# Patient Record
Sex: Female | Born: 1995 | Race: Black or African American | Hispanic: No | Smoking: Never smoker
Health system: Southern US, Community
[De-identification: ages and names within clinical notes are randomized; demographics above are authoritative.]

## PROBLEM LIST (undated history)

## (undated) DIAGNOSIS — F32A Depression, unspecified: Secondary | ICD-10-CM

## (undated) DIAGNOSIS — G43909 Migraine, unspecified, not intractable, without status migrainosus: Secondary | ICD-10-CM

## (undated) DIAGNOSIS — A599 Trichomoniasis, unspecified: Secondary | ICD-10-CM

## (undated) DIAGNOSIS — M1990 Unspecified osteoarthritis, unspecified site: Secondary | ICD-10-CM

## (undated) DIAGNOSIS — I10 Essential (primary) hypertension: Secondary | ICD-10-CM

## (undated) HISTORY — DX: Migraine, unspecified, not intractable, without status migrainosus: G43.909

---

## 2019-04-07 ENCOUNTER — Emergency Department (HOSPITAL_COMMUNITY): Payer: Managed Care, Other (non HMO)

## 2019-04-07 ENCOUNTER — Emergency Department (HOSPITAL_COMMUNITY)
Admission: EM | Admit: 2019-04-07 | Discharge: 2019-04-07 | Disposition: A | Discharge status: Home / Self Care | Payer: Managed Care, Other (non HMO) | Attending: Emergency Medicine | Admitting: Emergency Medicine

## 2019-04-07 ENCOUNTER — Other Ambulatory Visit: Payer: Self-pay

## 2019-04-07 ENCOUNTER — Encounter (HOSPITAL_COMMUNITY): Payer: Self-pay | Admitting: Emergency Medicine

## 2019-04-07 DIAGNOSIS — M25561 Pain in right knee: Secondary | ICD-10-CM | POA: Diagnosis present

## 2019-04-07 MED ORDER — IBUPROFEN 800 MG PO TABS: 800.0000 mg | ORAL_TABLET | Freq: Three times a day (TID) | ORAL | 0 refills | Status: DC | PRN
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
## 2019-04-07 NOTE — Discharge Instructions (Signed)
Follow-up with the orthopedist provided.  Return here as needed.  Ice and elevate your knee.
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
## 2019-04-07 NOTE — ED Notes (Signed)
Patient ambulated to xray and back to room unassisted, without difficulty.

## 2019-04-07 NOTE — ED Notes (Signed)
An After Visit Summary was printed and given to the patient. Discharge instructions given and no further questions at this time.  

## 2019-04-27 ENCOUNTER — Other Ambulatory Visit: Payer: Self-pay

## 2019-04-27 ENCOUNTER — Encounter (HOSPITAL_COMMUNITY): Payer: Self-pay | Admitting: Emergency Medicine

## 2019-04-27 ENCOUNTER — Emergency Department (HOSPITAL_COMMUNITY)
Admission: EM | Admit: 2019-04-27 | Discharge: 2019-04-27 | Disposition: A | Discharge status: Home / Self Care | Payer: Managed Care, Other (non HMO) | Attending: Emergency Medicine | Admitting: Emergency Medicine

## 2019-04-27 DIAGNOSIS — G8929 Other chronic pain: Secondary | ICD-10-CM | POA: Diagnosis not present

## 2019-04-27 DIAGNOSIS — Z79899 Other long term (current) drug therapy: Secondary | ICD-10-CM | POA: Diagnosis not present

## 2019-04-27 DIAGNOSIS — M25561 Pain in right knee: Secondary | ICD-10-CM | POA: Diagnosis not present

## 2019-04-27 MED ORDER — MELOXICAM 15 MG PO TABS: 15.0000 mg | ORAL_TABLET | Freq: Every day | ORAL | 0 refills | Status: DC

## 2019-04-27 MED ORDER — LIDOCAINE 5 % EX PTCH: 1.0000 | MEDICATED_PATCH | CUTANEOUS | 0 refills | Status: DC

## 2019-04-27 MED ORDER — LIDOCAINE 5 % EX PTCH
1.0000 | MEDICATED_PATCH | CUTANEOUS | Status: DC
  Administered 2019-04-27: 1 via TRANSDERMAL
  Filled 2019-04-27: qty 1

## 2019-04-27 NOTE — ED Triage Notes (Signed)
Pt reports that she hurt her right knee over year ago. Reports was seen here for pain not to long ago and was told would need to see an orthopedic. Reports had swelling yesterday.
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
## 2019-04-27 NOTE — Discharge Instructions (Addendum)
Take anti-inflammatory as prescribed.  Follow-up with orthopedics.

## 2019-05-18 ENCOUNTER — Emergency Department (HOSPITAL_COMMUNITY)
Admission: EM | Admit: 2019-05-18 | Discharge: 2019-05-18 | Disposition: A | Discharge status: Home / Self Care | Payer: Managed Care, Other (non HMO) | Attending: Emergency Medicine | Admitting: Emergency Medicine

## 2019-05-18 ENCOUNTER — Encounter (HOSPITAL_COMMUNITY): Payer: Self-pay

## 2019-05-18 ENCOUNTER — Emergency Department (HOSPITAL_COMMUNITY): Payer: Managed Care, Other (non HMO)

## 2019-05-18 ENCOUNTER — Other Ambulatory Visit: Payer: Self-pay

## 2019-05-18 DIAGNOSIS — W19XXXA Unspecified fall, initial encounter: Secondary | ICD-10-CM | POA: Diagnosis not present

## 2019-05-18 DIAGNOSIS — Y999 Unspecified external cause status: Secondary | ICD-10-CM | POA: Insufficient documentation

## 2019-05-18 DIAGNOSIS — Z79899 Other long term (current) drug therapy: Secondary | ICD-10-CM | POA: Diagnosis not present

## 2019-05-18 DIAGNOSIS — Y9389 Activity, other specified: Secondary | ICD-10-CM | POA: Diagnosis not present

## 2019-05-18 DIAGNOSIS — Y9289 Other specified places as the place of occurrence of the external cause: Secondary | ICD-10-CM | POA: Insufficient documentation

## 2019-05-18 DIAGNOSIS — S8391XA Sprain of unspecified site of right knee, initial encounter: Secondary | ICD-10-CM | POA: Insufficient documentation

## 2019-05-18 DIAGNOSIS — S8991XA Unspecified injury of right lower leg, initial encounter: Secondary | ICD-10-CM | POA: Diagnosis present

## 2019-05-18 NOTE — ED Provider Notes (Signed)
[facility] DEPT Provider Note   CSN: [id number] Arrival date & time: 05/18/19  4098     History Chief Complaint  Patient presents with  . Knee Pain    Rachael Hahn is a 24 y.o. female.  Patient presents to the emergency department for evaluation of right knee injury.  Patient reports a fall earlier today.  She has a history of problems with the knee in the past, was told she might of torn a ligament.  Patient complaining of pain in the knee that worsens with movement.  No other injury from the fall.        History reviewed. No pertinent past medical history.  There are no problems to display for this patient.   History reviewed. No pertinent surgical history.   OB History   No obstetric history on file.     No family history on file.  Social History   Tobacco Use  . Smoking status: Not on file  Substance Use Topics  . Alcohol use: Not on file  . Drug use: Not on file    Home Medications Prior to Admission medications   Medication Sig Start Date End Date Taking? Authorizing Provider  ibuprofen (ADVIL) 800 MG tablet Take 1 tablet (800 mg total) by mouth every 8 (eight) hours as needed. 04/07/19   Lawyer, Harrell Gave, PA-C  lidocaine (LIDODERM) 5 % Place 1 patch onto the skin daily. Remove & Discard patch within 12 hours or as directed by MD 04/27/19   Henderly, Britni A, PA-C  meloxicam (MOBIC) 15 MG tablet Take 1 tablet (15 mg total) by mouth daily. 04/27/19   Henderly, Britni A, PA-C    Allergies    Amoxicillin  Review of Systems   Review of Systems  Musculoskeletal: Positive for arthralgias.  Neurological: Negative.     Physical Exam Updated Vital Signs BP (!) 140/99 (BP Location: Left Arm)   Pulse 83   Temp 98.1 F (36.7 C) (Oral)   Resp 18   SpO2 98%   Physical Exam Vitals and nursing note reviewed.  Constitutional:      Appearance: Normal appearance.  HENT:     Head: Atraumatic.  Pulmonary:     Effort:  Pulmonary effort is normal.  Musculoskeletal:     Right knee: No swelling, deformity, effusion, erythema, lacerations or crepitus. Normal range of motion. Tenderness present. No LCL laxity, MCL laxity, ACL laxity or PCL laxity. Normal alignment and normal patellar mobility.  Skin:    General: Skin is warm and dry.     Findings: No bruising or ecchymosis.  Neurological:     Mental Status: She is alert.     Sensory: Sensation is intact.     Motor: Motor function is intact.     ED Results / Procedures / Treatments   Labs (all labs ordered are listed, but only abnormal results are displayed) Labs Reviewed - No data to display  EKG None  Radiology DG Knee Complete 4 Views Right  Result Date: 05/18/2019 CLINICAL DATA:  Fall EXAM: RIGHT KNEE - COMPLETE 4+ VIEW COMPARISON:  04/07/2019 FINDINGS: No fracture or malalignment. Joint spaces are maintained. No significant knee effusion IMPRESSION: Negative. Electronically Signed   By: Donavan Foil M.D.   On: 05/18/2019 01:35    Procedures Procedures (including critical care time)  Medications Ordered in ED Medications - No data to display  ED Course  I have reviewed the triage vital signs and the nursing notes.  Pertinent labs &  imaging results that were available during my care of the patient were reviewed by me and considered in my medical decision making (see chart for details).    MDM Rules/Calculators/A&P                      X-ray negative.  I do not appreciate any ligamentous instability on examination.  Immobilized with knee immobilizer, NSAIDs.  Follow-up with orthopedics as needed.  Final Clinical Impression(s) / ED Diagnoses Final diagnoses:  Sprain of right knee, unspecified ligament, initial encounter    Rx / DC Orders ED Discharge Orders    None       Javed Cotto, Canary Brim, MD 05/18/19 [id number]

## 2019-05-18 NOTE — ED Notes (Signed)
Below order not completed by EW. 

## 2019-05-18 NOTE — Discharge Instructions (Signed)
Rest and ice the area today.  Take Motrin and/or Tylenol as needed for pain.  Follow-up with orthopedics if not improving.

## 2019-05-18 NOTE — ED Triage Notes (Signed)
Patient arrived stating that tonight she fell down 5 steps at work and is having pain in her right knee. States she already had problems with that knee but has not followed up with orthopedics yet. Patient ambulatory in triage.

## 2019-07-23 ENCOUNTER — Emergency Department (HOSPITAL_COMMUNITY)
Admission: EM | Admit: 2019-07-23 | Discharge: 2019-07-23 | Disposition: A | Discharge status: Home / Self Care | Payer: Managed Care, Other (non HMO) | Attending: Emergency Medicine | Admitting: Emergency Medicine

## 2019-07-23 ENCOUNTER — Emergency Department (HOSPITAL_COMMUNITY): Payer: Managed Care, Other (non HMO)

## 2019-07-23 ENCOUNTER — Encounter (HOSPITAL_COMMUNITY): Payer: Self-pay | Admitting: *Deleted

## 2019-07-23 DIAGNOSIS — F419 Anxiety disorder, unspecified: Secondary | ICD-10-CM

## 2019-07-23 DIAGNOSIS — R0789 Other chest pain: Secondary | ICD-10-CM | POA: Diagnosis not present

## 2019-07-23 LAB — POC URINE PREG, ED: Preg Test, Ur: NEGATIVE

## 2019-07-23 NOTE — Discharge Instructions (Signed)
You can take Tylenol or ibuprofen as needed to help with your symptoms. Follow-up using the resource guide for counseling. Return to the ER if you have worsening chest pain, shortness of breath, leg swelling or thoughts of wanting to harm yourself or others.

## 2019-07-23 NOTE — ED Triage Notes (Signed)
Per EMS, pt picked up from work, complains of anxiety and chest pain, worse with movement and breathing. She reports she has been under a lot of stress recently. EKG unremarkable.  BP 160/100 HR 86 O2 100% CBG 77

## 2019-07-23 NOTE — ED Provider Notes (Signed)
[facility]-EMERGENCY DEPT Provider Note   CSN: [id number] Arrival date & time: 07/23/19  8657     History Chief Complaint  Patient presents with  . Chest Pain  . Anxiety    Rachael Hahn is a 24 y.o. female presents to ED with a chief complaint of chest pain, anxiety.  States that she was at work prior to arrival on her break when she began feeling like her chest was tight and she was short of breath.  She states his symptoms have gradually improved while being in the ED.  Patient states that she is more anxious and depressed as usual, is tearful.  No specific trigger today to cause her to feel this way.  She denies leg swelling, history of DVT or PE, recent immobilization, cough, vomiting, abdominal pain.   Denies SI, HI, AVH.  HPI     History reviewed. No pertinent past medical history.  There are no problems to display for this patient.   History reviewed. No pertinent surgical history.   OB History   No obstetric history on file.     No family history on file.  Social History   Tobacco Use  . Smoking status: Never Smoker  . Smokeless tobacco: Never Used  Substance Use Topics  . Alcohol use: Not Currently  . Drug use: Not Currently    Home Medications Prior to Admission medications   Medication Sig Start Date End Date Taking? Authorizing Provider  ibuprofen (ADVIL) 800 MG tablet Take 1 tablet (800 mg total) by mouth every 8 (eight) hours as needed. 04/07/19   Lawyer, Cristal Deer, PA-C  lidocaine (LIDODERM) 5 % Place 1 patch onto the skin daily. Remove & Discard patch within 12 hours or as directed by MD 04/27/19   Henderly, Britni A, PA-C  meloxicam (MOBIC) 15 MG tablet Take 1 tablet (15 mg total) by mouth daily. 04/27/19   Henderly, Britni A, PA-C    Allergies    Amoxicillin  Review of Systems   Review of Systems  Constitutional: Negative for appetite change, chills and fever.  HENT: Negative for ear pain, rhinorrhea, sneezing and  sore throat.   Eyes: Negative for photophobia and visual disturbance.  Respiratory: Positive for shortness of breath. Negative for cough, chest tightness and wheezing.   Cardiovascular: Positive for chest pain. Negative for palpitations.  Gastrointestinal: Negative for abdominal pain, blood in stool, constipation, diarrhea, nausea and vomiting.  Genitourinary: Negative for dysuria, hematuria and urgency.  Musculoskeletal: Negative for myalgias.  Skin: Negative for rash.  Neurological: Negative for dizziness, weakness and light-headedness.  Psychiatric/Behavioral: Positive for dysphoric mood. The patient is nervous/anxious.     Physical Exam Updated Vital Signs BP (!) 155/105 (BP Location: Left Arm)   Pulse 75   Temp 98.4 F (36.9 C) (Oral)   Resp 18   SpO2 100%   Physical Exam Vitals and nursing note reviewed.  Constitutional:      General: She is not in acute distress.    Appearance: She is well-developed.  HENT:     Head: Normocephalic and atraumatic.     Nose: Nose normal.  Eyes:     General: No scleral icterus.       Left eye: No discharge.     Conjunctiva/sclera: Conjunctivae normal.  Cardiovascular:     Rate and Rhythm: Normal rate and regular rhythm.     Heart sounds: Normal heart sounds. No murmur heard.  No friction rub. No gallop.   Pulmonary:  Effort: Pulmonary effort is normal. No respiratory distress.     Breath sounds: Normal breath sounds.  Abdominal:     General: Bowel sounds are normal. There is no distension.     Palpations: Abdomen is soft.     Tenderness: There is no abdominal tenderness. There is no guarding.  Musculoskeletal:        General: Normal range of motion.     Cervical back: Normal range of motion and neck supple.  Skin:    General: Skin is warm and dry.     Findings: No rash.  Neurological:     Mental Status: She is alert.     Motor: No abnormal muscle tone.     Coordination: Coordination normal.  Psychiatric:     Comments:  Tearful.     ED Results / Procedures / Treatments   Labs (all labs ordered are listed, but only abnormal results are displayed) Labs Reviewed  POC URINE PREG, ED    EKG None  Radiology DG Chest 2 View  Result Date: 07/23/2019 CLINICAL DATA:  Chest pain. EXAM: CHEST - 2 VIEW COMPARISON:  None. FINDINGS: The heart size and mediastinal contours are within normal limits. Both lungs are clear. No pneumothorax or pleural effusion is noted. The visualized skeletal structures are unremarkable. IMPRESSION: No active cardiopulmonary disease. Electronically Signed   By: Marijo Conception M.D.   On: 07/23/2019 10:20    Procedures Procedures (including critical care time)  Medications Ordered in ED Medications - No data to display  ED Course  I have reviewed the triage vital signs and the nursing notes.  Pertinent labs & imaging results that were available during my care of the patient were reviewed by me and considered in my medical decision making (see chart for details).    MDM Rules/Calculators/A&P                          24 year old female presenting to the ED with a chief complaint of chest pain and anxiety.  Reports chest tightness and shortness of breath just prior to arrival while at work.  Reports increased stress, anxiety and depression recently.  She denies any SI, HI or AVH.  Denies any leg swelling, recent immobilization, history of DVT or PE.  On exam patient tearful, unwilling to disclose more information about recent stressors in her life.  Lungs are clear to auscultation bilaterally.  She is not tachycardic, tachypneic or hypoxic.  EKG shows normal sinus rhythm.  Chest x-ray shows no acute findings.  Patient resting comfortably here.  Attempted to call behavioral health specialist for further recommendations but patient stating she wants to be discharged without further evaluation.  I feel this is reasonable as her work-up is reassuring and she is not suicidal or homicidal.   She does not appear to be actively hallucinating.  She is requesting outpatient resources for counseling.  I suspect that her chest pain could be due to her recent anxiety attack.  I doubt ACS, she is PERC negative and no evidence of abnormalities on chest x-ray.  We will have her return for worsening symptoms.  All imaging, if done today, including plain films, CT scans, and ultrasounds, independently reviewed by me, and interpretations confirmed via formal radiology reads.  Patient is hemodynamically stable, in NAD, and able to ambulate in the ED. Evaluation does not show pathology that would require ongoing emergent intervention or inpatient treatment. I explained the diagnosis to the patient.  Pain has been managed and has no complaints prior to discharge. Patient is comfortable with above plan and is stable for discharge at this time. All questions were answered prior to disposition. Strict return precautions for returning to the ED were discussed. Encouraged follow up with PCP.   An After Visit Summary was printed and given to the patient.   Portions of this note were generated with Scientist, clinical (histocompatibility and immunogenetics). Dictation errors may occur despite best attempts at proofreading.  Final Clinical Impression(s) / ED Diagnoses Final diagnoses:  Chest wall pain  Anxiety    Rx / DC Orders ED Discharge Orders    None       Dietrich Pates, PA-C 07/23/19 1240    Lorre Nick, MD 07/25/19 [phone]

## 2019-09-13 ENCOUNTER — Encounter (HOSPITAL_COMMUNITY): Payer: Self-pay

## 2019-09-13 ENCOUNTER — Emergency Department (HOSPITAL_COMMUNITY)
Admission: EM | Admit: 2019-09-13 | Discharge: 2019-09-13 | Disposition: A | Discharge status: Home / Self Care | Payer: Managed Care, Other (non HMO) | Attending: Emergency Medicine | Admitting: Emergency Medicine

## 2019-09-13 ENCOUNTER — Other Ambulatory Visit: Payer: Self-pay

## 2019-09-13 DIAGNOSIS — J392 Other diseases of pharynx: Secondary | ICD-10-CM | POA: Insufficient documentation

## 2019-09-13 DIAGNOSIS — Z5321 Procedure and treatment not carried out due to patient leaving prior to being seen by health care provider: Secondary | ICD-10-CM | POA: Diagnosis not present

## 2019-09-13 DIAGNOSIS — R0981 Nasal congestion: Secondary | ICD-10-CM | POA: Insufficient documentation

## 2019-09-13 NOTE — ED Triage Notes (Signed)
Patient states she has nasal congestion and an itchy throat x 2 days. Patient states, "I am here to prove to my room mate that I do not have Covid."

## 2019-12-10 ENCOUNTER — Emergency Department (HOSPITAL_COMMUNITY)
Admission: EM | Admit: 2019-12-10 | Discharge: 2019-12-10 | Disposition: A | Discharge status: Home / Self Care | Payer: Managed Care, Other (non HMO) | Attending: Emergency Medicine | Admitting: Emergency Medicine

## 2019-12-10 ENCOUNTER — Encounter (HOSPITAL_COMMUNITY): Payer: Self-pay

## 2019-12-10 DIAGNOSIS — H6002 Abscess of left external ear: Secondary | ICD-10-CM

## 2019-12-10 MED ORDER — LIDOCAINE HCL 2 % IJ SOLN
5.0000 mL | Freq: Once | INTRAMUSCULAR | Status: AC
  Administered 2019-12-10: 100 mg via INTRADERMAL
  Filled 2019-12-10: qty 20

## 2019-12-10 NOTE — ED Triage Notes (Addendum)
Pt presents with c/o bump in her left ear. Pt reports the bump has been present for approx one week but just started hurting today.

## 2019-12-10 NOTE — Discharge Instructions (Signed)
Continue to apply warm compress to affected area several times daily for the next 3 days.  Take over the counter tylenol or ibuprofen as needed for pain.

## 2019-12-10 NOTE — ED Provider Notes (Signed)
[facility]-EMERGENCY DEPT Provider Note   CSN: [id number] Arrival date & time: 12/10/19  0957     History Chief Complaint  Patient presents with  . Bump in ear    Rachael Hahn is a 24 y.o. female.  The history is provided by the patient. No language interpreter was used.     24 year old female presenting with an ear complaint.   patient report for the past week she has noticed a bump today on here left earlobe.  Initially it was not painful but for the past 1 to 2 days she has noticed increasing pain when she palpated.  Pain is sharp, nonradiating without any hearing changes drainage.  No fever no recent injury.  She is not up-to-date with tetanus.  No specific treatment tried.  History reviewed. No pertinent past medical history.  There are no problems to display for this patient.   History reviewed. No pertinent surgical history.   OB History   No obstetric history on file.     Family History  Problem Relation Age of Onset  . Healthy Mother   . Healthy Father     Social History   Tobacco Use  . Smoking status: Never Smoker  . Smokeless tobacco: Never Used  Vaping Use  . Vaping Use: Never used  Substance Use Topics  . Alcohol use: Not Currently  . Drug use: Not Currently    Home Medications Prior to Admission medications   Medication Sig Start Date End Date Taking? Authorizing Provider  ibuprofen (ADVIL) 800 MG tablet Take 1 tablet (800 mg total) by mouth every 8 (eight) hours as needed. Patient not taking: Reported on 12/10/2019 04/07/19   Charlestine Night, PA-C  lidocaine (LIDODERM) 5 % Place 1 patch onto the skin daily. Remove & Discard patch within 12 hours or as directed by MD Patient not taking: Reported on 12/10/2019 04/27/19   Henderly, Britni A, PA-C  meloxicam (MOBIC) 15 MG tablet Take 1 tablet (15 mg total) by mouth daily. Patient not taking: Reported on 12/10/2019 04/27/19   Henderly, Britni A, PA-C    Allergies      Amoxicillin  Review of Systems   Review of Systems  Constitutional: Negative for fever.  Neurological: Negative for numbness and headaches.    Physical Exam Updated Vital Signs BP (!) 147/103 (BP Location: Left Arm)   Pulse 76   Temp 98.9 F (37.2 C) (Oral)   Resp 18   LMP  (LMP Unknown)   SpO2 95%   Physical Exam Vitals and nursing note reviewed.  Constitutional:      General: She is not in acute distress.    Appearance: She is well-developed.  HENT:     Head: Atraumatic.     Ears:     Comments: Left earlobe: There is a 3 mm nodule with a pustular head noted in between the helix and antihelix portion of the lobe that is tender to palpation.  No surrounding erythema.  Ear canal and TM are normal in appearance. Eyes:     Conjunctiva/sclera: Conjunctivae normal.  Musculoskeletal:     Cervical back: Neck supple.  Skin:    Findings: No rash.  Neurological:     Mental Status: She is alert.     ED Results / Procedures / Treatments   Labs (all labs ordered are listed, but only abnormal results are displayed) Labs Reviewed - No data to display  EKG None  Radiology No results found.  Procedures .Marland KitchenIncision and Drainage  Date/Time: 12/10/2019 11:35 AM Performed by: Fayrene Helper, PA-C Authorized by: Fayrene Helper, PA-C   Consent:    Consent obtained:  Verbal   Consent given by:  Patient   Risks discussed:  Bleeding, incomplete drainage, pain and damage to other organs   Alternatives discussed:  No treatment Universal protocol:    Procedure explained and questions answered to patient or proxy's satisfaction: yes     Relevant documents present and verified: yes     Test results available and properly labeled: yes     Imaging studies available: yes     Required blood products, implants, devices, and special equipment available: yes     Site/side marked: yes     Immediately prior to procedure a time out was called: yes     Patient identity confirmed:  Verbally with  patient Location:    Type:  Abscess   Size:  44mm   Location:  Head   Head location:  L external ear Pre-procedure details:    Skin preparation:  Betadine Anesthesia (see MAR for exact dosages):    Anesthesia method:  Local infiltration   Local anesthetic:  Lidocaine 2% w/o epi Procedure type:    Complexity:  Simple Procedure details:    Incision types:  Single straight   Incision depth:  Subcutaneous   Scalpel blade:  11   Drainage:  Purulent   Drainage amount:  Scant   Packing materials:  None Post-procedure details:    Patient tolerance of procedure:  Tolerated well, no immediate complications   (including critical care time)  Medications Ordered in ED Medications  lidocaine (XYLOCAINE) 2 % (with pres) injection 100 mg (has no administration in time range)    ED Course  I have reviewed the triage vital signs and the nursing notes.  Pertinent labs & imaging results that were available during my care of the patient were reviewed by me and considered in my medical decision making (see chart for details).    MDM Rules/Calculators/A&P                          BP (!) 147/103 (BP Location: Left Arm)   Pulse 76   Temp 98.9 F (37.2 C) (Oral)   Resp 18   LMP  (LMP Unknown)   SpO2 95%   Final Clinical Impression(s) / ED Diagnoses Final diagnoses:  Abscess of left external ear    Rx / DC Orders ED Discharge Orders    None     10:46 AM Impression here with complaints of pain to her left earlobe.  It appears patient has a small localized cutaneous abscess amenable for drainage.  Patient is not up-to-date, I offered to update her tetanus but patient refused.  11:36 AM I&D performed by PA student Marcelino Duster under my direct supervision.  Successful I&D of abscess on L earlobe.  Pt tolerates well.  Wound care instruction provided.    Fayrene Helper, PA-C 12/10/19 1138    Sabas Sous, MD 12/10/19 [phone]

## 2020-02-06 ENCOUNTER — Other Ambulatory Visit: Payer: Self-pay

## 2020-02-06 ENCOUNTER — Emergency Department (HOSPITAL_COMMUNITY)
Admission: EM | Admit: 2020-02-06 | Discharge: 2020-02-07 | Disposition: A | Discharge status: Home / Self Care | Payer: Medicaid Other | Attending: Emergency Medicine | Admitting: Emergency Medicine

## 2020-02-06 DIAGNOSIS — R059 Cough, unspecified: Secondary | ICD-10-CM | POA: Insufficient documentation

## 2020-02-06 DIAGNOSIS — R509 Fever, unspecified: Secondary | ICD-10-CM | POA: Insufficient documentation

## 2020-02-06 DIAGNOSIS — Z5321 Procedure and treatment not carried out due to patient leaving prior to being seen by health care provider: Secondary | ICD-10-CM | POA: Insufficient documentation

## 2020-04-10 ENCOUNTER — Encounter (HOSPITAL_COMMUNITY): Payer: Self-pay | Admitting: Emergency Medicine

## 2020-04-10 ENCOUNTER — Emergency Department (HOSPITAL_COMMUNITY)
Admission: EM | Admit: 2020-04-10 | Discharge: 2020-04-10 | Disposition: A | Discharge status: Home / Self Care | Payer: Medicaid Other | Attending: Emergency Medicine | Admitting: Emergency Medicine

## 2020-04-10 ENCOUNTER — Other Ambulatory Visit: Payer: Self-pay

## 2020-04-10 DIAGNOSIS — R42 Dizziness and giddiness: Secondary | ICD-10-CM | POA: Insufficient documentation

## 2020-04-10 DIAGNOSIS — R001 Bradycardia, unspecified: Secondary | ICD-10-CM | POA: Insufficient documentation

## 2020-04-10 DIAGNOSIS — F419 Anxiety disorder, unspecified: Secondary | ICD-10-CM | POA: Insufficient documentation

## 2020-04-10 DIAGNOSIS — I10 Essential (primary) hypertension: Secondary | ICD-10-CM | POA: Insufficient documentation

## 2020-04-10 HISTORY — DX: Depression, unspecified: F32.A

## 2020-04-10 HISTORY — DX: Trichomoniasis, unspecified: A59.9

## 2020-04-10 HISTORY — DX: Essential (primary) hypertension: I10

## 2020-04-10 LAB — CBC WITH DIFFERENTIAL/PLATELET
Abs Immature Granulocytes: 0.01 10*3/uL (ref 0.00–0.07)
Basophils Absolute: 0 10*3/uL (ref 0.0–0.1)
Basophils Relative: 1 %
Eosinophils Absolute: 0.1 10*3/uL (ref 0.0–0.5)
Eosinophils Relative: 2 %
HCT: 42.3 % (ref 36.0–46.0)
Hemoglobin: 13.7 g/dL (ref 12.0–15.0)
Immature Granulocytes: 0 %
Lymphocytes Relative: 51 %
Lymphs Abs: 1.7 10*3/uL (ref 0.7–4.0)
MCH: 29.4 pg (ref 26.0–34.0)
MCHC: 32.4 g/dL (ref 30.0–36.0)
MCV: 90.8 fL (ref 80.0–100.0)
Monocytes Absolute: 0.3 10*3/uL (ref 0.1–1.0)
Monocytes Relative: 7 %
Neutro Abs: 1.4 10*3/uL — ABNORMAL LOW (ref 1.7–7.7)
Neutrophils Relative %: 39 %
Platelets: 247 10*3/uL (ref 150–400)
RBC: 4.66 MIL/uL (ref 3.87–5.11)
RDW: 13.2 % (ref 11.5–15.5)
WBC: 3.4 10*3/uL — ABNORMAL LOW (ref 4.0–10.5)
nRBC: 0 % (ref 0.0–0.2)

## 2020-04-10 LAB — BASIC METABOLIC PANEL
Anion gap: 8 (ref 5–15)
BUN: 14 mg/dL (ref 6–20)
CO2: 21 mmol/L — ABNORMAL LOW (ref 22–32)
Calcium: 9.6 mg/dL (ref 8.9–10.3)
Chloride: 111 mmol/L (ref 98–111)
Creatinine, Ser: 0.56 mg/dL (ref 0.44–1.00)
GFR, Estimated: >60 mL/min (ref >60)
Glucose, Bld: 79 mg/dL (ref 70–99)
Potassium: 4.1 mmol/L (ref 3.5–5.1)
Sodium: 140 mmol/L (ref 135–145)

## 2020-04-10 LAB — I-STAT BETA HCG BLOOD, ED (MC, WL, AP ONLY): I-stat hCG, quantitative: <5 m[IU]/mL (ref <5)

## 2020-04-10 MED ORDER — MECLIZINE HCL 25 MG PO TABS
25.0000 mg | ORAL_TABLET | Freq: Once | ORAL | Status: AC
  Administered 2020-04-10: 25 mg via ORAL
  Filled 2020-04-10: qty 1

## 2020-04-10 NOTE — Discharge Instructions (Signed)
Follow-up with your primary care provider and the cardiologist listed below. Return to the ER if you start to experience worsening dizziness, chest pain, shortness of breath, severe headache.

## 2020-04-10 NOTE — ED Notes (Signed)
Pt refusing more bloodwork, very afraid of needles, provider made aware

## 2020-04-10 NOTE — ED Provider Notes (Signed)
[facility]-EMERGENCY DEPT Provider Note   CSN: [id number] Arrival date & time: 04/10/20  5027     History Chief Complaint  Patient presents with  . Hypertension    Rachael Hahn is a 25 y.o. female with a past medical history of hypertension presenting to the ED with a chief complaint of dizziness.  States that she woke up this morning feeling dizzy to the point where she was having trouble ambulating.  States that she "almost ran into a wall because I was so dizzy."  Went to bed last night in her usual state of health.  She does not have a primary care provider, sees her OB/GYN annually.  She noticed at her last appointment that she was hypertensive to the 150s systolic.  She tried to establish care with a PCP but has an appointment in 1 month.  She feels like her blood pressure is running high again.  She is not on any antihypertensive medication.  She denies any headache, vision changes, vomiting, chest pain, shortness of breath, fever, neck stiffness.  HPI     Past Medical History:  Diagnosis Date  . Depression   . Hypertension   . Trichomoniasis     There are no problems to display for this patient.   History reviewed. No pertinent surgical history.   OB History   No obstetric history on file.     Family History  Problem Relation Age of Onset  . Healthy Mother   . Healthy Father     Social History   Tobacco Use  . Smoking status: Never Smoker  . Smokeless tobacco: Never Used  Vaping Use  . Vaping Use: Never used  Substance Use Topics  . Alcohol use: Not Currently  . Drug use: Not Currently    Home Medications Prior to Admission medications   Medication Sig Start Date End Date Taking? Authorizing Provider  ibuprofen (ADVIL) 800 MG tablet Take 1 tablet (800 mg total) by mouth every 8 (eight) hours as needed. Patient not taking: No sig reported 04/07/19   Charlestine Night, PA-C    Allergies    Amoxicillin  Review of Systems    Review of Systems  Constitutional: Negative for appetite change, chills and fever.  HENT: Negative for ear pain, rhinorrhea, sneezing and sore throat.   Eyes: Negative for photophobia and visual disturbance.  Respiratory: Negative for cough, chest tightness, shortness of breath and wheezing.   Cardiovascular: Negative for chest pain and palpitations.  Gastrointestinal: Negative for abdominal pain, blood in stool, constipation, diarrhea, nausea and vomiting.  Genitourinary: Negative for dysuria, hematuria and urgency.  Musculoskeletal: Negative for myalgias.  Skin: Negative for rash.  Neurological: Positive for dizziness. Negative for weakness and light-headedness.    Physical Exam Updated Vital Signs BP (!) 135/93   Pulse (!) 58   Temp 98.3 F (36.8 C) (Oral)   Resp 18   Ht 5\' 3"  (1.6 m)   Wt 85.3 kg   SpO2 100%   BMI 33.30 kg/m   Physical Exam Vitals and nursing note reviewed.  Constitutional:      General: She is not in acute distress.    Appearance: She is well-developed and well-nourished.  HENT:     Head: Normocephalic and atraumatic.     Nose: Nose normal.  Eyes:     General: No scleral icterus.       Right eye: No discharge.        Left eye: No discharge.  Extraocular Movements: EOM normal.     Conjunctiva/sclera: Conjunctivae normal.     Pupils: Pupils are equal, round, and reactive to light.  Cardiovascular:     Rate and Rhythm: Normal rate and regular rhythm.     Pulses: Intact distal pulses.     Heart sounds: Normal heart sounds. No murmur heard. No friction rub. No gallop.   Pulmonary:     Effort: Pulmonary effort is normal. No respiratory distress.     Breath sounds: Normal breath sounds.  Abdominal:     General: Bowel sounds are normal. There is no distension.     Palpations: Abdomen is soft.     Tenderness: There is no abdominal tenderness. There is no guarding.  Musculoskeletal:        General: No edema. Normal range of motion.     Cervical  back: Normal range of motion and neck supple.  Skin:    General: Skin is warm and dry.     Findings: No rash.  Neurological:     Mental Status: She is alert and oriented to person, place, and time.     Cranial Nerves: No cranial nerve deficit.     Sensory: No sensory deficit.     Motor: No weakness or abnormal muscle tone.     Coordination: Coordination normal.     Comments: Moving all extremities without difficulty.  No facial asymmetry noted.  Strength 5/5 in bilateral upper and lower extremities.  Normal finger-to-nose coordination bilaterally.  Psychiatric:        Mood and Affect: Mood is anxious.     ED Results / Procedures / Treatments   Labs (all labs ordered are listed, but only abnormal results are displayed) Labs Reviewed  BASIC METABOLIC PANEL - Abnormal; Notable for the following components:      Result Value   CO2 21 (*)    All other components within normal limits  CBC WITH DIFFERENTIAL/PLATELET - Abnormal; Notable for the following components:   WBC 3.4 (*)    Neutro Abs 1.4 (*)    All other components within normal limits  I-STAT BETA HCG BLOOD, ED ([facility], [facility], [facility] ONLY)    EKG None  Radiology No results found.  Procedures Procedures   Medications Ordered in ED Medications  meclizine (ANTIVERT) tablet 25 mg (25 mg Oral Given 04/10/20 5284)    ED Course  I have reviewed the triage vital signs and the nursing notes.  Pertinent labs & imaging results that were available during my care of the patient were reviewed by me and considered in my medical decision making (see chart for details).  Clinical Course as of 04/10/20 1045  Fri Apr 10, 2020  0935 Creatinine: 0.56 [HK]  0935 BUN: 14 [HK]  0935 I-stat hCG, quantitative: <5.0 [HK]  0935 BP: 130/88 [HK]  0937 Spoke to on-call cardiologist Dr. Bufford Buttner.  Reviewed patient's EKG but no further recommendations for admission.  He does asked that we check a TSH on her but does not feel that she needs cardiology  f/u. [HK]  [id number] I was directly involved in this patients medical care.  [JH]  1035 BP(!): 135/93 [HK]    Clinical Course User Index [HK] Dietrich Pates, PA-C [JH] China, Eustace Moore, MD   MDM Rules/Calculators/A&P                          25 year old female with a past medical history of hypertension presenting to the ED with  a chief complaint of dizziness.  Woke up this morning feeling dizzy to the point where she was having trouble ambulating.  Noticed that her last OB/GYN appointment that she was hypertensive to 150 systolic.  She tried to establish care with a PCP does not have an appointment until April 2022.  She felt that because of her symptoms, her blood pressure was running high again.  Denies any headache, vision changes, vomiting, shortness of breath or fever.  On exam patient without any neurological deficits.  No numbness or weakness on exam.  She does appear anxious.  Initial blood pressure was in the 150 systolic on arrival.  Lab work including CBC, BMP unremarkable.  hCG is negative.  Blood pressure improved here without intervention.  Patient is intermittently bradycardic here with rates down to the 40s.  I spoke to cardiology who recommends checking TSH but otherwise no indication for admission at this time.  She significantly improved with meclizine here.  Attempted to obtain additional blood work for [facility] but patient refuses and states that she would rather be discharged to follow-up with her primary care provider.  I will still give her follow-up information for cardiology due to her new intermittent bradycardia.  She ambulates here without difficulty.  Suspect that her dizziness could be due to vertigo versus this bradycardia.  States that she does not want to be admitted to the hospital. Return precautions given.   Patient is hemodynamically stable, in NAD, and able to ambulate in the ED. Evaluation does not show pathology that would require ongoing emergent intervention or inpatient  treatment. I explained the diagnosis to the patient. Pain has been managed and has no complaints prior to discharge. Patient is comfortable with above plan and is stable for discharge at this time. All questions were answered prior to disposition. Strict return precautions for returning to the ED were discussed. Encouraged follow up with PCP.   An After Visit Summary was printed and given to the patient.   Portions of this note were generated with Scientist, clinical (histocompatibility and immunogenetics). Dictation errors may occur despite best attempts at proofreading.  Final Clinical Impression(s) / ED Diagnoses Final diagnoses:  Bradycardia  Vertigo    Rx / DC Orders ED Discharge Orders    None       Dietrich Pates, PA-C 04/10/20 1045    Cheryll Cockayne, MD 04/11/20 1119

## 2020-04-10 NOTE — ED Triage Notes (Addendum)
States she was so dizzy this morning that she almost ran into a wall, endorses issues with HTN, is not on any HTN medications at this time, needs an appt w/ primary care. Denies N/V/D, headaches, or fevers. Denies LOC, falls or hitting her head.

## 2020-04-10 NOTE — ED Notes (Signed)
Pt advised to follow up w cardiologist and PCP

## 2020-07-09 ENCOUNTER — Emergency Department (HOSPITAL_COMMUNITY): Payer: Self-pay

## 2020-07-09 ENCOUNTER — Encounter (HOSPITAL_COMMUNITY): Payer: Self-pay

## 2020-07-09 ENCOUNTER — Emergency Department (HOSPITAL_COMMUNITY)
Admission: EM | Admit: 2020-07-09 | Discharge: 2020-07-09 | Disposition: A | Discharge status: Home / Self Care | Payer: Self-pay | Attending: Emergency Medicine | Admitting: Emergency Medicine

## 2020-07-09 ENCOUNTER — Other Ambulatory Visit: Payer: Self-pay

## 2020-07-09 DIAGNOSIS — I10 Essential (primary) hypertension: Secondary | ICD-10-CM | POA: Insufficient documentation

## 2020-07-09 DIAGNOSIS — R42 Dizziness and giddiness: Secondary | ICD-10-CM | POA: Insufficient documentation

## 2020-07-09 DIAGNOSIS — R001 Bradycardia, unspecified: Secondary | ICD-10-CM | POA: Insufficient documentation

## 2020-07-09 DIAGNOSIS — R002 Palpitations: Secondary | ICD-10-CM | POA: Insufficient documentation

## 2020-07-09 LAB — CBC
HCT: 40.1 % (ref 36.0–46.0)
Hemoglobin: 13.3 g/dL (ref 12.0–15.0)
MCH: 29.2 pg (ref 26.0–34.0)
MCHC: 33.2 g/dL (ref 30.0–36.0)
MCV: 88.1 fL (ref 80.0–100.0)
Platelets: 260 10*3/uL (ref 150–400)
RBC: 4.55 MIL/uL (ref 3.87–5.11)
RDW: 12.4 % (ref 11.5–15.5)
WBC: 3.9 10*3/uL — ABNORMAL LOW (ref 4.0–10.5)
nRBC: 0 % (ref 0.0–0.2)

## 2020-07-09 LAB — BASIC METABOLIC PANEL
Anion gap: 8 (ref 5–15)
BUN: 14 mg/dL (ref 6–20)
CO2: 24 mmol/L (ref 22–32)
Calcium: 9.6 mg/dL (ref 8.9–10.3)
Chloride: 108 mmol/L (ref 98–111)
Creatinine, Ser: 0.65 mg/dL (ref 0.44–1.00)
GFR, Estimated: >60 mL/min (ref >60)
Glucose, Bld: 90 mg/dL (ref 70–99)
Potassium: 3.9 mmol/L (ref 3.5–5.1)
Sodium: 140 mmol/L (ref 135–145)

## 2020-07-09 LAB — I-STAT BETA HCG BLOOD, ED (MC, WL, AP ONLY): I-stat hCG, quantitative: <5 m[IU]/mL (ref <5)

## 2020-07-09 NOTE — ED Provider Notes (Signed)
[facility]-EMERGENCY DEPT Provider Note   CSN: [id number] Arrival date & time: 07/09/20  0751     History Chief Complaint  Patient presents with  . Palpitations    Rachael Hahn is a 25 y.o. female.  HPI     25 year old female with history of hypertension comes in with chief complaint of palpitations.  She reports that this morning she had an episode of palpitations that lasted for about an hour.  The palpitation is described as racing heart rate.  She is unsure if she was skipping any beats or having extra beats.  Associated with the palpitations she had some dizziness but no chest pain, shortness of breath.  She denies any similar episodes prior to this current event, that started yesterday.  There is no specific evoking, aggravating or relieving factor with the palpitations.  She denies any substance abuse, heavy alcohol use.  There is no family history of CAD, premature deaths or any ICD placement.  Past Medical History:  Diagnosis Date  . Depression   . Hypertension   . Trichomoniasis     There are no problems to display for this patient.   History reviewed. No pertinent surgical history.   OB History   No obstetric history on file.     Family History  Problem Relation Age of Onset  . Healthy Mother   . Healthy Father     Social History   Tobacco Use  . Smoking status: Never Smoker  . Smokeless tobacco: Never Used  Vaping Use  . Vaping Use: Never used  Substance Use Topics  . Alcohol use: Not Currently  . Drug use: Not Currently    Home Medications Prior to Admission medications   Medication Sig Start Date End Date Taking? Authorizing Provider  ibuprofen (ADVIL) 800 MG tablet Take 1 tablet (800 mg total) by mouth every 8 (eight) hours as needed. Patient not taking: No sig reported 04/07/19   Charlestine Night, PA-C    Allergies    Amoxicillin  Review of Systems   Review of Systems  Constitutional: Positive for activity  change.  Respiratory: Negative for shortness of breath.   Cardiovascular: Positive for palpitations. Negative for chest pain.  Neurological: Positive for dizziness.  Hematological: Does not bruise/bleed easily.    Physical Exam Updated Vital Signs BP (!) 151/104   Pulse 64   Temp 98.4 F (36.9 C) (Oral)   Resp 20   Ht 5\' 6"  (1.676 m)   Wt 90.7 kg   SpO2 100%   BMI 32.28 kg/m   Physical Exam Vitals and nursing note reviewed.  Constitutional:      Appearance: She is well-developed.  HENT:     Head: Normocephalic and atraumatic.  Cardiovascular:     Rate and Rhythm: Bradycardia present.  Pulmonary:     Effort: Pulmonary effort is normal.  Abdominal:     General: Bowel sounds are normal.  Musculoskeletal:     Cervical back: Normal range of motion and neck supple.  Skin:    General: Skin is warm and dry.  Neurological:     Mental Status: She is alert and oriented to person, place, and time.     ED Results / Procedures / Treatments   Labs (all labs ordered are listed, but only abnormal results are displayed) Labs Reviewed  CBC - Abnormal; Notable for the following components:      Result Value   WBC 3.9 (*)    All other components within normal  limits  BASIC METABOLIC PANEL  I-STAT BETA HCG BLOOD, ED ([facility], [facility], [facility] ONLY)    EKG EKG Interpretation  Date/Time:  Thursday July 09 2020 08:15:26 EDT Ventricular Rate:  65 PR Interval:  166 QRS Duration: 87 QT Interval:  395 QTC Calculation: 411 R Axis:   95 Text Interpretation: Sinus rhythm Borderline right axis deviation No acute changes normal intervals Confirmed by Derwood Kaplan ([id number]) on 07/09/2020 11:58:52 AM   Radiology DG Chest 2 View  Result Date: 07/09/2020 CLINICAL DATA:  Intermittent palpitations since yesterday. EXAM: CHEST - 2 VIEW COMPARISON:  07/23/2019 FINDINGS: The cardiomediastinal silhouette is within normal limits. The lungs are well inflated and clear. There is no evidence of pleural effusion  or pneumothorax. No acute osseous abnormality is identified. IMPRESSION: No active cardiopulmonary disease. Electronically Signed   By: Sebastian Ache M.D.   On: 07/09/2020 09:16    Procedures Procedures   Medications Ordered in ED Medications - No data to display  ED Course  I have reviewed the triage vital signs and the nursing notes.  Pertinent labs & imaging results that were available during my care of the patient were reviewed by me and considered in my medical decision making (see chart for details).    MDM Rules/Calculators/A&P                          Patient comes in with chief complaint of palpitations.  She is noted to have bradycardia during my assessment.  EKG is not showing any interval disturbance.  Family history, social history is reassuring.  We observe the patient for over 3 hours in the ER.  She did not have any episodes of arrhythmia while here.  She did have some episodes of irregular heart rhythm, but she was not in A. fib and at all point she was in sinus rhythm.  We will give her follow-up with: Wellness.  Strict ER return precautions also discussed.  She will come back if she has repeat episodes.  Final Clinical Impression(s) / ED Diagnoses Final diagnoses:  Palpitations    Rx / DC Orders ED Discharge Orders    None       Derwood Kaplan, MD 07/09/20 1200

## 2020-07-09 NOTE — ED Notes (Signed)
Took patient a [place] sandwich and cheese stick per Dr. Brandy Hale request

## 2020-07-09 NOTE — Discharge Instructions (Addendum)
You are seen in the ER for palpitations.  Over the course of more than 3 hours of monitoring you in the ER, we did not have any episodes of abnormal cardiac rhythm or episodes of elevated heart rate.    As discussed, we would like to know what your heart rhythm is in when you are having those episodes of palpitations.  For that, often patients will need outpatient follow-up, monitoring.  Please contact [facility] to see if they can get you in for an appointment.  Return to the ER if you start having repeat episodes of palpitations, chest pain, dizziness, shortness of breath.

## 2020-07-09 NOTE — ED Triage Notes (Signed)
Patient c/o intemittent palpitations since yesterday. patient denies any SOB.

## 2020-10-29 ENCOUNTER — Encounter (HOSPITAL_COMMUNITY): Payer: Self-pay

## 2020-10-29 ENCOUNTER — Emergency Department (HOSPITAL_COMMUNITY)
Admission: EM | Admit: 2020-10-29 | Discharge: 2020-10-29 | Disposition: A | Discharge status: Home / Self Care | Payer: Self-pay | Attending: Emergency Medicine | Admitting: Emergency Medicine

## 2020-10-29 ENCOUNTER — Other Ambulatory Visit: Payer: Self-pay

## 2020-10-29 ENCOUNTER — Emergency Department (HOSPITAL_COMMUNITY): Payer: Self-pay

## 2020-10-29 DIAGNOSIS — W19XXXA Unspecified fall, initial encounter: Secondary | ICD-10-CM

## 2020-10-29 DIAGNOSIS — S8001XA Contusion of right knee, initial encounter: Secondary | ICD-10-CM | POA: Insufficient documentation

## 2020-10-29 DIAGNOSIS — W010XXA Fall on same level from slipping, tripping and stumbling without subsequent striking against object, initial encounter: Secondary | ICD-10-CM | POA: Insufficient documentation

## 2020-10-29 DIAGNOSIS — I10 Essential (primary) hypertension: Secondary | ICD-10-CM | POA: Insufficient documentation

## 2020-10-29 DIAGNOSIS — Y92511 Restaurant or cafe as the place of occurrence of the external cause: Secondary | ICD-10-CM | POA: Insufficient documentation

## 2020-10-29 DIAGNOSIS — Y990 Civilian activity done for income or pay: Secondary | ICD-10-CM | POA: Insufficient documentation

## 2020-10-29 MED ORDER — IBUPROFEN 800 MG PO TABS: 800.0000 mg | ORAL_TABLET | Freq: Once | ORAL | Status: DC

## 2020-10-29 NOTE — Discharge Instructions (Addendum)
Your knee is examined and does not appear to have any internal injury.  X-rays reveal no signs of broken bones Please use Ace wrap, ice therapy, and ibuprofen as needed for pain. You should place weight only as tolerated for the next week. If you continue to have pain please follow-up with urgent care or your primary care doctor.

## 2020-10-29 NOTE — ED Provider Notes (Signed)
[facility]-EMERGENCY DEPT Provider Note   CSN: [id number] Arrival date & time: 10/29/20  0846     History Chief Complaint  Patient presents with   Rachael Hahn    Rachael Hahn is a 25 y.o. female.  HPI 25 year old female presents today complaining of right knee pain after fall.  She states she was at work at [employer] a couple of days ago when she slipped on some water and fell landing on both of her knees.  She initially had some pain in both knees but the pain in the left knee is much better.  She continues have pain in the right knee with palpation and walking.  The pain is moderate to severe.  She is using over-the-counter medications without relief.  She has noted some mild swelling.  She denies any other injury.    Past Medical History:  Diagnosis Date   Depression    Hypertension    Trichomoniasis     There are no problems to display for this patient.   History reviewed. No pertinent surgical history.   OB History   No obstetric history on file.     Family History  Problem Relation Age of Onset   Healthy Mother    Healthy Father     Social History   Tobacco Use   Smoking status: Never   Smokeless tobacco: Never  Vaping Use   Vaping Use: Never used  Substance Use Topics   Alcohol use: Not Currently   Drug use: Not Currently    Home Medications Prior to Admission medications   Medication Sig Start Date End Date Taking? Authorizing Provider  ibuprofen (ADVIL) 800 MG tablet Take 1 tablet (800 mg total) by mouth every 8 (eight) hours as needed. Patient not taking: No sig reported 04/07/19   Charlestine Night, PA-C    Allergies    Amoxicillin  Review of Systems   Review of Systems  All other systems reviewed and are negative.  Physical Exam Updated Vital Signs BP (!) 152/105 (BP Location: Left Arm)   Pulse 60   Temp 98.9 F (37.2 C) (Oral)   Resp 18   SpO2 99%   Physical Exam Vitals and nursing note reviewed.   Constitutional:      Appearance: Normal appearance. She is obese.  HENT:     Head: Normocephalic and atraumatic.     Right Ear: External ear normal.     Left Ear: External ear normal.     Nose: Nose normal.     Mouth/Throat:     Mouth: Mucous membranes are moist.     Pharynx: Oropharynx is clear.  Eyes:     Pupils: Pupils are equal, round, and reactive to light.  Cardiovascular:     Rate and Rhythm: Normal rate and regular rhythm.     Pulses: Normal pulses.  Pulmonary:     Effort: Pulmonary effort is normal.  Abdominal:     General: Abdomen is flat.     Palpations: Abdomen is soft.  Musculoskeletal:     Cervical back: Normal range of motion.     Comments: Right hip, ankle, foot normal Mild tenderness palpation of the right knee Full active range of motion No ligamentous laxity noted on exam Pulses intact Left knee appears normal on exam  Skin:    General: Skin is warm.     Capillary Refill: Capillary refill takes less than 2 seconds.  Neurological:     General: No focal deficit present.  Mental Status: She is alert.  Psychiatric:        Mood and Affect: Mood normal.    ED Results / Procedures / Treatments   Labs (all labs ordered are listed, but only abnormal results are displayed) Labs Reviewed - No data to display  EKG None  Radiology DG Knee Complete 4 Views Right  Result Date: 10/29/2020 CLINICAL DATA:  Fall EXAM: RIGHT KNEE - COMPLETE 4+ VIEW COMPARISON:  Knee radiographs 05/18/2019 FINDINGS: There is no acute fracture or dislocation. Knee alignment is normal. The joint spaces are preserved. The soft tissues are unremarkable. There is no effusion. IMPRESSION: Normal knee radiographs. Electronically Signed   By: Lesia Hausen M.D.   On: 10/29/2020 11:21    Procedures Procedures   Medications Ordered in ED Medications  ibuprofen (ADVIL) tablet 800 mg (has no administration in time range)    ED Course  I have reviewed the triage vital signs and the  nursing notes.  Pertinent labs & imaging results that were available during my care of the patient were reviewed by me and considered in my medical decision making (see chart for details).    MDM Rules/Calculators/A&P                           25 year old female presents today with right knee pain after fall.  Radiology imaging obtained without evidence of acute fracture.  Exam without any evidence of ligamentous laxity.  Pain likely due to contusion.  Patient advised regarding work restrictions, ongoing conservative therapy including nonsteroidal anti-inflammatories, compression, and elevation.  Patient advised regarding need for follow-up. Final Clinical Impression(s) / ED Diagnoses Final diagnoses:  Fall, initial encounter  Contusion of right knee, initial encounter    Rx / DC Orders ED Discharge Orders     None        Margarita Grizzle, MD 10/29/20 1208

## 2020-10-29 NOTE — ED Triage Notes (Signed)
Pt reports slipping on wet floor at work and falling on her right knee. Pt reports chronic right knee pain prior to fall.

## 2021-01-21 ENCOUNTER — Other Ambulatory Visit: Payer: Self-pay

## 2021-01-21 ENCOUNTER — Encounter (HOSPITAL_BASED_OUTPATIENT_CLINIC_OR_DEPARTMENT_OTHER): Payer: Self-pay | Admitting: Emergency Medicine

## 2021-01-21 ENCOUNTER — Emergency Department (HOSPITAL_BASED_OUTPATIENT_CLINIC_OR_DEPARTMENT_OTHER)
Admission: EM | Admit: 2021-01-21 | Discharge: 2021-01-21 | Disposition: A | Discharge status: Home / Self Care | Payer: Medicaid Other | Attending: Emergency Medicine | Admitting: Emergency Medicine

## 2021-01-21 DIAGNOSIS — R030 Elevated blood-pressure reading, without diagnosis of hypertension: Secondary | ICD-10-CM

## 2021-01-21 DIAGNOSIS — I10 Essential (primary) hypertension: Secondary | ICD-10-CM | POA: Insufficient documentation

## 2021-01-21 MED ORDER — ACETAMINOPHEN 325 MG PO TABS
650.0000 mg | ORAL_TABLET | Freq: Once | ORAL | Status: AC
  Administered 2021-01-21: 650 mg via ORAL
  Filled 2021-01-21: qty 2

## 2021-01-21 NOTE — Discharge Instructions (Addendum)
Please follow-up with PCP regarding elevated blood pressure reading

## 2021-01-21 NOTE — ED Provider Notes (Addendum)
[facility] EMERGENCY DEPT Provider Note   CSN: [id number] Arrival date & time: 01/21/21  9629     History Chief Complaint  Patient presents with   Hypertension    Rachael Hahn is a 25 y.o. female.  This is a 25 y.o. female with significant medical history as below, including HTN who presents to the ED with complaint of elevated blood pressure reading.  Patient reports that she checked her blood pressure yesterday and it was mildly elevated, she cannot recall when the blood pressure reading was.  She had a mild headache described as a pulsatile sensation she took some Motrin which did improve her symptoms at that time.  She woke up this morning and rechecked her blood pressure and it was again abnormal, her headache had resumed so she reports the ER for evaluation.  Patient prior history of hypertension but does not take medications for it.  Does not regularly follow with PCP.  Headache described as mild, throbbing, not associate with numbness, tingling, vision changes, hearing changes.  No recent stimulant use, no illicit drug use, no tobacco or alcohol use recently.  She is ambulatory.  Tolerant oral intake with difficulty, no change in bowel or bladder function, no chest pain or dyspnea.  No current palpitations.  No other acute complaints offered at this time.  The history is provided by the patient. No language interpreter was used.  Hypertension Associated symptoms include headaches. Pertinent negatives include no chest pain, no abdominal pain and no shortness of breath.      Past Medical History:  Diagnosis Date   Depression    Hypertension    Trichomoniasis     There are no problems to display for this patient.   History reviewed. No pertinent surgical history.   OB History   No obstetric history on file.     Family History  Problem Relation Age of Onset   Healthy Mother    Healthy Father     Social History   Tobacco Use   Smoking status:  Never   Smokeless tobacco: Never  Vaping Use   Vaping Use: Never used  Substance Use Topics   Alcohol use: Not Currently   Drug use: Not Currently    Home Medications Prior to Admission medications   Medication Sig Start Date End Date Taking? Authorizing Provider  ibuprofen (ADVIL) 800 MG tablet Take 1 tablet (800 mg total) by mouth every 8 (eight) hours as needed. Patient not taking: No sig reported 04/07/19   Charlestine Night, PA-C    Allergies    Amoxicillin  Review of Systems   Review of Systems  Constitutional:  Negative for activity change and fever.  HENT:  Negative for facial swelling and trouble swallowing.   Eyes:  Negative for discharge and redness.  Respiratory:  Negative for cough and shortness of breath.   Cardiovascular:  Negative for chest pain and palpitations.  Gastrointestinal:  Negative for abdominal pain and nausea.  Genitourinary:  Negative for dysuria and flank pain.  Musculoskeletal:  Negative for back pain and gait problem.  Skin:  Negative for pallor and rash.  Neurological:  Positive for headaches. Negative for syncope, weakness, light-headedness and numbness.   Physical Exam Updated Vital Signs BP (!) 140/97 (BP Location: Right Arm)    Pulse 60    Temp 98.1 F (36.7 C) (Oral)    Resp 16    Ht 5\' 3"  (1.6 m)    Wt 93.9 kg    SpO2 99%  BMI 36.67 kg/m   Physical Exam Vitals and nursing note reviewed.  Constitutional:      General: She is not in acute distress.    Appearance: Normal appearance. She is obese. She is not ill-appearing, toxic-appearing or diaphoretic.  HENT:     Head: Normocephalic and atraumatic.     Right Ear: External ear normal.     Left Ear: External ear normal.     Nose: Nose normal.     Mouth/Throat:     Mouth: Mucous membranes are moist.  Eyes:     General: No scleral icterus.       Right eye: No discharge.        Left eye: No discharge.     Extraocular Movements: Extraocular movements intact.     Pupils: Pupils  are equal, round, and reactive to light.  Cardiovascular:     Rate and Rhythm: Normal rate and regular rhythm.     Pulses: Normal pulses.     Heart sounds: Normal heart sounds.  Pulmonary:     Effort: Pulmonary effort is normal. No respiratory distress.     Breath sounds: Normal breath sounds.  Abdominal:     General: Abdomen is flat.     Tenderness: There is no abdominal tenderness.  Musculoskeletal:        General: Normal range of motion.     Cervical back: Normal range of motion.     Right lower leg: No edema.     Left lower leg: No edema.  Skin:    General: Skin is warm and dry.     Capillary Refill: Capillary refill takes less than 2 seconds.  Neurological:     Mental Status: She is alert and oriented to person, place, and time.     GCS: GCS eye subscore is 4. GCS verbal subscore is 5. GCS motor subscore is 6.     Cranial Nerves: Cranial nerves 2-12 are intact.     Sensory: Sensation is intact.     Motor: Motor function is intact.     Coordination: Coordination is intact.     Gait: Gait is intact. Gait normal.  Psychiatric:        Mood and Affect: Mood normal.        Behavior: Behavior normal.    ED Results / Procedures / Treatments   Labs (all labs ordered are listed, but only abnormal results are displayed) Labs Reviewed - No data to display  EKG None  Radiology No results found.  Procedures Procedures   Medications Ordered in ED Medications  acetaminophen (TYLENOL) tablet 650 mg (650 mg Oral Given 01/21/21 2376)    ED Course  I have reviewed the triage vital signs and the nursing notes.  Pertinent labs & imaging results that were available during my care of the patient were reviewed by me and considered in my medical decision making (see chart for details).    MDM Rules/Calculators/A&P                          CC: htn, ha  This patient complains of above; this involves an extensive number of treatment options and is a complaint that carries with  it a high risk of complications and morbidity. Vital signs were reviewed. Serious etiologies considered.  Patient with mildly elevated blood pressure.  She also has a headache.  Neuro exam is nonfocal.  Headache described as mild, not described as the worst headache of her  life.  Headache reported as gradual onset.  Not associate with numbness, tingling, sensation changes  Give Tylenol.  Record review:   Previous records obtained and reviewed   Reassessment:  Headache has resolved, blood pressure has improved without intervention other than Tylenol.  Discussed dietary changes with the patient, avoidance of risk factors that could increase BP.  Advised to follow-up with primary care doctor regarding elevated blood pressure reading.  The patient improved significantly and was discharged in stable condition. Detailed discussions were had with the patient regarding current findings, and need for close f/u with PCP or on call doctor. The patient has been instructed to return immediately if the symptoms worsen in any way for re-evaluation. Patient verbalized understanding and is in agreement with current care plan. All questions answered prior to discharge.          This chart was dictated using voice recognition software.  Despite best efforts to proofread,  errors can occur which can change the documentation meaning.  Final Clinical Impression(s) / ED Diagnoses Final diagnoses:  Elevated blood pressure reading    Rx / DC Orders ED Discharge Orders     None        Sloan Leiter, DO 01/21/21 0951    Sloan Leiter, DO 01/21/21 [phone]

## 2021-01-21 NOTE — ED Triage Notes (Signed)
Pt BIB [facility] with c/o HTN and blurred vision. Pt endorses hearing heartbeat

## 2021-02-25 ENCOUNTER — Emergency Department (HOSPITAL_COMMUNITY)
Admission: EM | Admit: 2021-02-25 | Discharge: 2021-02-25 | Disposition: A | Discharge status: Home / Self Care | Payer: Medicaid Other | Attending: Emergency Medicine | Admitting: Emergency Medicine

## 2021-02-25 ENCOUNTER — Encounter (HOSPITAL_COMMUNITY): Payer: Self-pay | Admitting: Emergency Medicine

## 2021-02-25 ENCOUNTER — Other Ambulatory Visit: Payer: Self-pay

## 2021-02-25 DIAGNOSIS — R519 Headache, unspecified: Secondary | ICD-10-CM | POA: Insufficient documentation

## 2021-02-25 DIAGNOSIS — G43909 Migraine, unspecified, not intractable, without status migrainosus: Secondary | ICD-10-CM | POA: Insufficient documentation

## 2021-02-25 LAB — POC URINE PREG, ED: Preg Test, Ur: NEGATIVE

## 2021-02-25 MED ORDER — ACETAMINOPHEN 325 MG PO TABS
650.0000 mg | ORAL_TABLET | Freq: Once | ORAL | Status: AC
  Administered 2021-02-25: 650 mg via ORAL
  Filled 2021-02-25: qty 2

## 2021-02-25 MED ORDER — KETOROLAC TROMETHAMINE 30 MG/ML IJ SOLN
30.0000 mg | Freq: Once | INTRAMUSCULAR | Status: AC
  Administered 2021-02-25: 30 mg via INTRAMUSCULAR
  Filled 2021-02-25: qty 1

## 2021-02-25 NOTE — Discharge Instructions (Signed)
While in the ED your blood pressure was high.  Please follow up with your primary care doctor or the wellness clinic for repeat evaluation as you may need medication.  High blood pressure can cause long term, potentially serious, damage if left untreated.  ° °

## 2021-02-25 NOTE — ED Provider Notes (Signed)
I assumed care of patient at shift change from previous team, please see their note for full H&P. Briefly patient is here for evaluation of headache.  Her previous team she is neuro intact without any red flags or concerning signs or symptoms.  Plan to reassess after meds.   Shortly after she was given meds patient requested discharge. She states that she is already feeling better. I discussed with her at length that her blood pressure is elevated and the importance of follow-up.  She states her understanding of this.  She requested a work note which was given.  Note: Portions of this report may have been transcribed using voice recognition software. Every effort was made to ensure accuracy; however, inadvertent computerized transcription errors may be present       Cristina Gong, PA-C 02/25/21 1722    Vanetta Mulders, MD 02/28/21 [phone]

## 2021-02-25 NOTE — ED Triage Notes (Signed)
Patient presents with migraines for 1 week. She believes this may be due to her blood pressure. The pain radiates from her temples to her nose bilateral.

## 2021-02-25 NOTE — ED Provider Notes (Signed)
[facility]-EMERGENCY DEPT Provider Note   CSN: [id number] Arrival date & time: 02/25/21  1222     History  Chief Complaint  Patient presents with   Migraine    Rachael Hahn is a 26 y.o. female.  With past medical history of hypertension who presents to the emergency department with headache.  Patient states that for 5 days she has had gradual onset of intermittent, severe, bitemporal headache that she describes as throbbing and that radiates to her nose bilaterally.  She has no other associated symptoms including photophobia, phonophobia, fever, head or neck trauma, visual changes, nausea or vomiting, orders prior to.  She has no history of headache or migraine.  Denies lightheadedness, dizziness, syncope, weakness, numbness or tingling in her upper or lower extremities.   Migraine Associated symptoms include headaches.      Home Medications Prior to Admission medications   Medication Sig Start Date End Date Taking? Authorizing Provider  ibuprofen (ADVIL) 800 MG tablet Take 1 tablet (800 mg total) by mouth every 8 (eight) hours as needed. Patient not taking: No sig reported 04/07/19   Charlestine Night, PA-C      Allergies    Amoxicillin    Review of Systems   Review of Systems  Constitutional:  Negative for fever.  HENT:  Negative for sinus pressure and sinus pain.   Eyes:  Negative for photophobia and visual disturbance.  Musculoskeletal:  Negative for neck pain and neck stiffness.  Neurological:  Positive for headaches. Negative for dizziness, syncope, weakness and light-headedness.  Psychiatric/Behavioral:  Negative for confusion.   All other systems reviewed and are negative.  Physical Exam Updated Vital Signs BP (!) 129/92    Pulse 93    Temp 98.7 F (37.1 C)    Resp 18    SpO2 100%  Physical Exam Vitals and nursing note reviewed.  Constitutional:      General: She is not in acute distress.    Appearance: Normal appearance. She is  normal weight. She is not ill-appearing or toxic-appearing.  HENT:     Head: Normocephalic and atraumatic.     Right Ear: Tympanic membrane normal.     Left Ear: Tympanic membrane normal.     Nose: Nose normal.     Mouth/Throat:     Mouth: Mucous membranes are moist.     Pharynx: Oropharynx is clear. No posterior oropharyngeal erythema.  Eyes:     General: No scleral icterus.    Extraocular Movements: Extraocular movements intact.     Conjunctiva/sclera: Conjunctivae normal.     Pupils: Pupils are equal, round, and reactive to light.  Cardiovascular:     Rate and Rhythm: Normal rate and regular rhythm.     Pulses: Normal pulses.     Heart sounds: No murmur heard. Pulmonary:     Effort: Pulmonary effort is normal. No respiratory distress.     Breath sounds: Normal breath sounds.  Abdominal:     General: Bowel sounds are normal.     Palpations: Abdomen is soft.  Musculoskeletal:        General: Normal range of motion.     Cervical back: Normal range of motion and neck supple. No rigidity or tenderness.  Skin:    General: Skin is warm and dry.     Capillary Refill: Capillary refill takes less than 2 seconds.  Neurological:     General: No focal deficit present.     Mental Status: She is alert and oriented to person,  place, and time. Mental status is at baseline.     Cranial Nerves: No cranial nerve deficit.     Sensory: No sensory deficit.     Motor: No weakness.     Gait: Gait normal.  Psychiatric:        Mood and Affect: Mood normal.        Behavior: Behavior normal.        Thought Content: Thought content normal.        Judgment: Judgment normal.    ED Results / Procedures / Treatments   Labs (all labs ordered are listed, but only abnormal results are displayed) Labs Reviewed  POC URINE PREG, ED    EKG None  Radiology No results found.  Procedures Procedures    Medications Ordered in ED Medications  ketorolac (TORADOL) 30 MG/ML injection 30 mg (30 mg  Intramuscular Given 02/25/21 1536)  acetaminophen (TYLENOL) tablet 650 mg (650 mg Oral Given 02/25/21 1536)    ED Course/ Medical Decision Making/ A&P                           Medical Decision Making Risk OTC drugs. Prescription drug management.  Patient presents to the ED with complaints of headache. This involves an extensive number of treatment options, and is a complaint that carries with it a moderate risk of complications and morbidity.   Additional history obtained:  Additional history obtained from: patient External records from outside source obtained and reviewed including: previous emergency department visits   Medications  I ordered medication including tylenol and toradol for pain Reevaluation of the patient after medication shows that patient improved  Dispostion: 1525: Care of patient handed off to Lyndel Safe, PA-C at this time. Final disposition will pend patient response to pain medication. If headache resolved, can be safely discharged home with PCP follow-up.  Final Clinical Impression(s) / ED Diagnoses Final diagnoses:  Acute nonintractable headache, unspecified headache type    Rx / DC Orders ED Discharge Orders     None         Cristopher Peru, PA-C 02/25/21 1946    Long, Arlyss Repress, MD 03/03/21 2014

## 2021-03-19 ENCOUNTER — Encounter: Payer: Self-pay | Admitting: Internal Medicine

## 2021-03-19 ENCOUNTER — Ambulatory Visit: Payer: Self-pay | Attending: Internal Medicine | Admitting: Internal Medicine

## 2021-03-19 VITALS — BP 126/77 | HR 64 | Resp 16 | Ht 64.0 in | Wt 212.2 lb

## 2021-03-19 DIAGNOSIS — G4489 Other headache syndrome: Secondary | ICD-10-CM | POA: Insufficient documentation

## 2021-03-19 DIAGNOSIS — R030 Elevated blood-pressure reading, without diagnosis of hypertension: Secondary | ICD-10-CM | POA: Insufficient documentation

## 2021-03-19 NOTE — Progress Notes (Signed)
Patient ID: Rachael Hahn, female    DOB: 1995/12/11  MRN: [id number]  CC: Hospitalization Follow-up (ED)   Subjective: Rachael Hahn is a 26 y.o. female who presents for new pt visit Her concerns today include:   No previous PCP Not on any med and no chronic issues  Seen in ER 02/25/21 for HA Headache started 1 month ago.  They occur about every other day. Located in the temples and around the eyes and nose. No lacrimation or redness of the eyes with the headaches. Endorses some photophobia.  No nausea, vomiting or blurred vision. Comes on gradually and lasts a few minutes.  Goes away and then can return a few hours later. Triggers include stress.  She has had a lot of stress at work. Takes ibuprofen four 200 mg tablets at a time which knocks it out. She feels changing dose of Depo-Provera shot may have caused the headaches.  She gets it through the health department.  She tells me she was on a higher dose and they have decreased the dose recently.  She was told by them that it can cause headaches.  She got her recent shot 2 days ago.  She has not had a headache since getting the last Depo-Provera shot. -She has been on Depo-Provera for several years at the urging of her mother.  However patient states that she is sexually active only with females and really does not need to be on the Depo-Provera.  Blood pressure elevated on ER visit.  In looking through the chart, seeing blood pressure has been elevated on previous encounters with the health system.  Patient reports being told in the past that blood pressure was elevated.  Never placed on blood pressure medication.    Current Outpatient Medications on File Prior to Visit  Medication Sig Dispense Refill   ibuprofen (ADVIL) 800 MG tablet Take 1 tablet (800 mg total) by mouth every 8 (eight) hours as needed. (Patient not taking: No sig reported) 21 tablet 0   No current facility-administered medications on file prior to  visit.    Allergies  Allergen Reactions   Amoxicillin Other (See Comments)    unknown    Social History   Socioeconomic History   Marital status: Single    Spouse name: Not on file   Number of children: Not on file   Years of education: Not on file   Highest education level: Not on file  Occupational History   Not on file  Tobacco Use   Smoking status: Never   Smokeless tobacco: Never  Vaping Use   Vaping Use: Never used  Substance and Sexual Activity   Alcohol use: Not Currently   Drug use: Not Currently   Sexual activity: Not on file  Other Topics Concern   Not on file  Social History Narrative   Not on file   Social Determinants of Health   Financial Resource Strain: Not on file  Food Insecurity: Not on file  Transportation Needs: Not on file  Physical Activity: Not on file  Stress: Not on file  Social Connections: Not on file  Intimate Partner Violence: Not on file    Family History  Problem Relation Age of Onset   Healthy Mother    Healthy Father     No past surgical history on file.  ROS: Review of Systems Negative except as stated above  PHYSICAL EXAM: BP 126/77    Pulse 64    Resp 16  Ht 5\' 4"  (1.626 m)    Wt 212 lb 3.2 oz (96.3 kg)    SpO2 95%    BMI 36.42 kg/m   Physical Exam Repeat BP 128/86 General appearance - alert, well appearing, and in no distress Mental status - normal mood, behavior, speech, dress, motor activity, and thought processes Neck - supple, no significant adenopathy Chest - clear to auscultation, no wheezes, rales or rhonchi, symmetric air entry Heart - normal rate, regular rhythm, normal S1, S2, no murmurs, rubs, clicks or gallops Neurological - cranial nerves II through XII intact, motor and sensory grossly normal bilaterally Extremities - peripheral pulses normal, no pedal edema, no clubbing or cyanosis   CMP Latest Ref Rng & Units 07/09/2020 04/10/2020  Glucose 70 - 99 mg/dL 90 79  BUN 6 - 20 mg/dL 14 14   Creatinine 06/10/2020 - 1.00 mg/dL 1.60 7.37  Sodium 1.06 - 145 mmol/L 140 140  Potassium 3.5 - 5.1 mmol/L 3.9 4.1  Chloride 98 - 111 mmol/L 108 111  CO2 22 - 32 mmol/L 24 21(L)  Calcium 8.9 - 10.3 mg/dL 9.6 9.6   Lipid Panel  No results found for: CHOL, TRIG, HDL, CHOLHDL, VLDL, LDLCALC, LDLDIRECT  CBC    Component Value Date/Time   WBC 3.9 (L) 07/09/2020 0828   RBC 4.55 07/09/2020 0828   HGB 13.3 07/09/2020 0828   HCT 40.1 07/09/2020 0828   PLT 260 07/09/2020 0828   MCV 88.1 07/09/2020 0828   MCH 29.2 07/09/2020 0828   MCHC 33.2 07/09/2020 0828   RDW 12.4 07/09/2020 0828   LYMPHSABS 1.7 04/10/2020 0903   MONOABS 0.3 04/10/2020 0903   EOSABS 0.1 04/10/2020 0903   BASOSABS 0.0 04/10/2020 0903    ASSESSMENT AND PLAN: 1. Headache syndrome -Headaches do not sound like typical migraine in that they last only a few minutes.  Cluster headaches also would last a lot longer.  I told the patient that if she feels her headaches are due to the Depo-Provera, she should consider stopping it especially since she is sexually active only with females. -Headaches respond well to the ibuprofen but the ibuprofen may be contributing to elevation in blood pressure.  I recommend taking Tylenol instead as needed.  2. Elevated blood pressure reading without diagnosis of hypertension Stop ibuprofen.  DASH diet discussed and encouraged. She does have access to a blood pressure monitoring device.  I requested that she check her blood pressure at least 2 times a week and record the readings.  Bring readings with her on follow-up in 1 month.   Patient was given the opportunity to ask questions.  Patient verbalized understanding of the plan and was able to repeat key elements of the plan.   No orders of the defined types were placed in this encounter.    Requested Prescriptions    No prescriptions requested or ordered in this encounter    Return in about 1 month (around 04/16/2021).  06/16/2021,  MD, FACP

## 2021-03-19 NOTE — Patient Instructions (Signed)
Check blood pressure twice a week and record the readings.  Normal blood pressure is less than 120/80.  DASH Eating Plan DASH stands for Dietary Approaches to Stop Hypertension. The DASH eating plan is a healthy eating plan that has been shown to: Reduce high blood pressure (hypertension). Reduce your risk for type 2 diabetes, heart disease, and stroke. Help with weight loss. What are tips for following this plan? Reading food labels Check food labels for the amount of salt (sodium) per serving. Choose foods with less than 5 percent of the Daily Value of sodium. Generally, foods with less than 300 milligrams (mg) of sodium per serving fit into this eating plan. To find whole grains, look for the word "whole" as the first word in the ingredient list. Shopping Buy products labeled as "low-sodium" or "no salt added." Buy fresh foods. Avoid canned foods and pre-made or frozen meals. Cooking Avoid adding salt when cooking. Use salt-free seasonings or herbs instead of table salt or sea salt. Check with your health care provider or pharmacist before using salt substitutes. Do not fry foods. Cook foods using healthy methods such as baking, boiling, grilling, roasting, and broiling instead. Cook with heart-healthy oils, such as olive, canola, avocado, soybean, or sunflower oil. Meal planning  Eat a balanced diet that includes: 4 or more servings of fruits and 4 or more servings of vegetables each day. Try to fill one-half of your plate with fruits and vegetables. 6-8 servings of whole grains each day. Less than 6 oz (170 g) of lean meat, poultry, or fish each day. A 3-oz (85-g) serving of meat is about the same size as a deck of cards. One egg equals 1 oz (28 g). 2-3 servings of low-fat dairy each day. One serving is 1 cup (237 mL). 1 serving of nuts, seeds, or beans 5 times each week. 2-3 servings of heart-healthy fats. Healthy fats called omega-3 fatty acids are found in foods such as walnuts,  flaxseeds, fortified milks, and eggs. These fats are also found in cold-water fish, such as sardines, salmon, and mackerel. Limit how much you eat of: Canned or prepackaged foods. Food that is high in trans fat, such as some fried foods. Food that is high in saturated fat, such as fatty meat. Desserts and other sweets, sugary drinks, and other foods with added sugar. Full-fat dairy products. Do not salt foods before eating. Do not eat more than 4 egg yolks a week. Try to eat at least 2 vegetarian meals a week. Eat more home-cooked food and less restaurant, buffet, and fast food. Lifestyle When eating at a restaurant, ask that your food be prepared with less salt or no salt, if possible. If you drink alcohol: Limit how much you use to: 0-1 drink a day for women who are not pregnant. 0-2 drinks a day for men. Be aware of how much alcohol is in your drink. In the [place] one drink equals one 12 oz bottle of beer (355 mL), one 5 oz glass of wine (148 mL), or one 1 oz glass of hard liquor (44 mL). General information Avoid eating more than 2,300 mg of salt a day. If you have hypertension, you may need to reduce your sodium intake to 1,500 mg a day. Work with your health care provider to maintain a healthy body weight or to lose weight. Ask what an ideal weight is for you. Get at least 30 minutes of exercise that causes your heart to beat faster (aerobic exercise) most days  of the week. Activities may include walking, swimming, or biking. Work with your health care provider or dietitian to adjust your eating plan to your individual calorie needs. What foods should I eat? Fruits All fresh, dried, or frozen fruit. Canned fruit in natural juice (without added sugar). Vegetables Fresh or frozen vegetables (raw, steamed, roasted, or grilled). Low-sodium or reduced-sodium tomato and vegetable juice. Low-sodium or reduced-sodium tomato sauce and tomato paste. Low-sodium or reduced-sodium canned  vegetables. Grains Whole-grain or whole-wheat bread. Whole-grain or whole-wheat pasta. Brown rice. [place]. Bulgur. Whole-grain and low-sodium cereals. Pita bread. Low-fat, low-sodium crackers. Whole-wheat flour tortillas. Meats and other proteins Skinless chicken or [place]. Ground chicken or [place]. Pork with fat trimmed off. Fish and seafood. Egg whites. Dried beans, peas, or lentils. Unsalted nuts, nut butters, and seeds. Unsalted canned beans. Lean cuts of beef with fat trimmed off. Low-sodium, lean precooked or cured meat, such as sausages or meat loaves. Dairy Low-fat (1%) or fat-free (skim) milk. Reduced-fat, low-fat, or fat-free cheeses. Nonfat, low-sodium ricotta or cottage cheese. Low-fat or nonfat yogurt. Low-fat, low-sodium cheese. Fats and oils Soft margarine without trans fats. Vegetable oil. Reduced-fat, low-fat, or light mayonnaise and salad dressings (reduced-sodium). Canola, safflower, olive, avocado, soybean, and sunflower oils. Avocado. Seasonings and condiments Herbs. Spices. Seasoning mixes without salt. Other foods Unsalted popcorn and pretzels. Fat-free sweets. The items listed above may not be a complete list of foods and beverages you can eat. Contact a dietitian for more information. What foods should I avoid? Fruits Canned fruit in a light or heavy syrup. Fried fruit. Fruit in cream or butter sauce. Vegetables Creamed or fried vegetables. Vegetables in a cheese sauce. Regular canned vegetables (not low-sodium or reduced-sodium). Regular canned tomato sauce and paste (not low-sodium or reduced-sodium). Regular tomato and vegetable juice (not low-sodium or reduced-sodium). Angie Fava. Olives. Grains Baked goods made with fat, such as croissants, muffins, or some breads. Dry pasta or rice meal packs. Meats and other proteins Fatty cuts of meat. Ribs. Fried meat. Berniece Salines. Bologna, salami, and other precooked or cured meats, such as sausages or meat loaves. Fat from  the back of a pig (fatback). Bratwurst. Salted nuts and seeds. Canned beans with added salt. Canned or smoked fish. Whole eggs or egg yolks. Chicken or [place] with skin. Dairy Whole or 2% milk, cream, and half-and-half. Whole or full-fat cream cheese. Whole-fat or sweetened yogurt. Full-fat cheese. Nondairy creamers. Whipped toppings. Processed cheese and cheese spreads. Fats and oils Butter. Stick margarine. Lard. Shortening. Ghee. Bacon fat. Tropical oils, such as coconut, palm kernel, or palm oil. Seasonings and condiments Onion salt, garlic salt, seasoned salt, table salt, and sea salt. Worcestershire sauce. Tartar sauce. Barbecue sauce. Teriyaki sauce. Soy sauce, including reduced-sodium. Steak sauce. Canned and packaged gravies. Fish sauce. Oyster sauce. Cocktail sauce. Store-bought horseradish. Ketchup. Mustard. Meat flavorings and tenderizers. Bouillon cubes. Hot sauces. Pre-made or packaged marinades. Pre-made or packaged taco seasonings. Relishes. Regular salad dressings. Other foods Salted popcorn and pretzels. The items listed above may not be a complete list of foods and beverages you should avoid. Contact a dietitian for more information. Where to find more information National Heart, Lung, and Blood Institute: https://wilson-eaton.com/ American Heart Association: www.heart.org Academy of Nutrition and Dietetics: www.eatright.[place]: www.kidney.org Summary The DASH eating plan is a healthy eating plan that has been shown to reduce high blood pressure (hypertension). It may also reduce your risk for type 2 diabetes, heart disease, and stroke. When on the DASH eating plan, aim  to eat more fresh fruits and vegetables, whole grains, lean proteins, low-fat dairy, and heart-healthy fats. With the DASH eating plan, you should limit salt (sodium) intake to 2,300 mg a day. If you have hypertension, you may need to reduce your sodium intake to 1,500 mg a day. Work with your  health care provider or dietitian to adjust your eating plan to your individual calorie needs. This information is not intended to replace advice given to you by your health care provider. Make sure you discuss any questions you have with your health care provider. Document Revised: 12/28/2018 Document Reviewed: 12/28/2018 Elsevier Patient Education  2022 [employer].

## 2021-04-08 IMAGING — CR DG KNEE COMPLETE 4+V*R*
4 series · 4 of 4 positions shown · non-contrast
Comparison: 04/07/2019

CLINICAL DATA: Fall

EXAM:
RIGHT KNEE - COMPLETE 4+ VIEW

[t knee ap right]
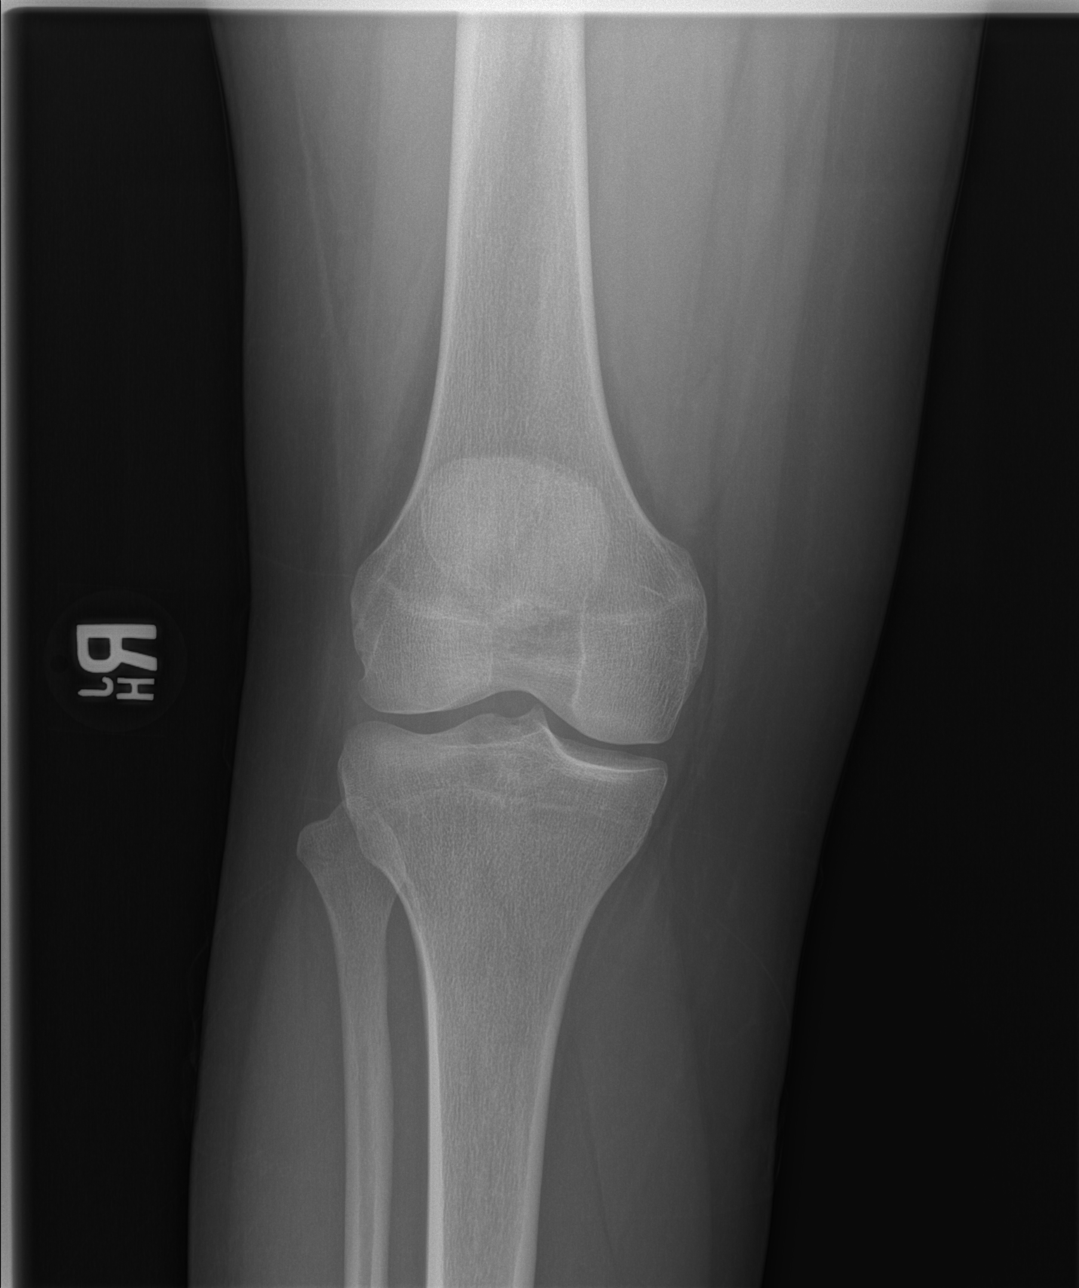

[t knee obl right (1 of 2)]
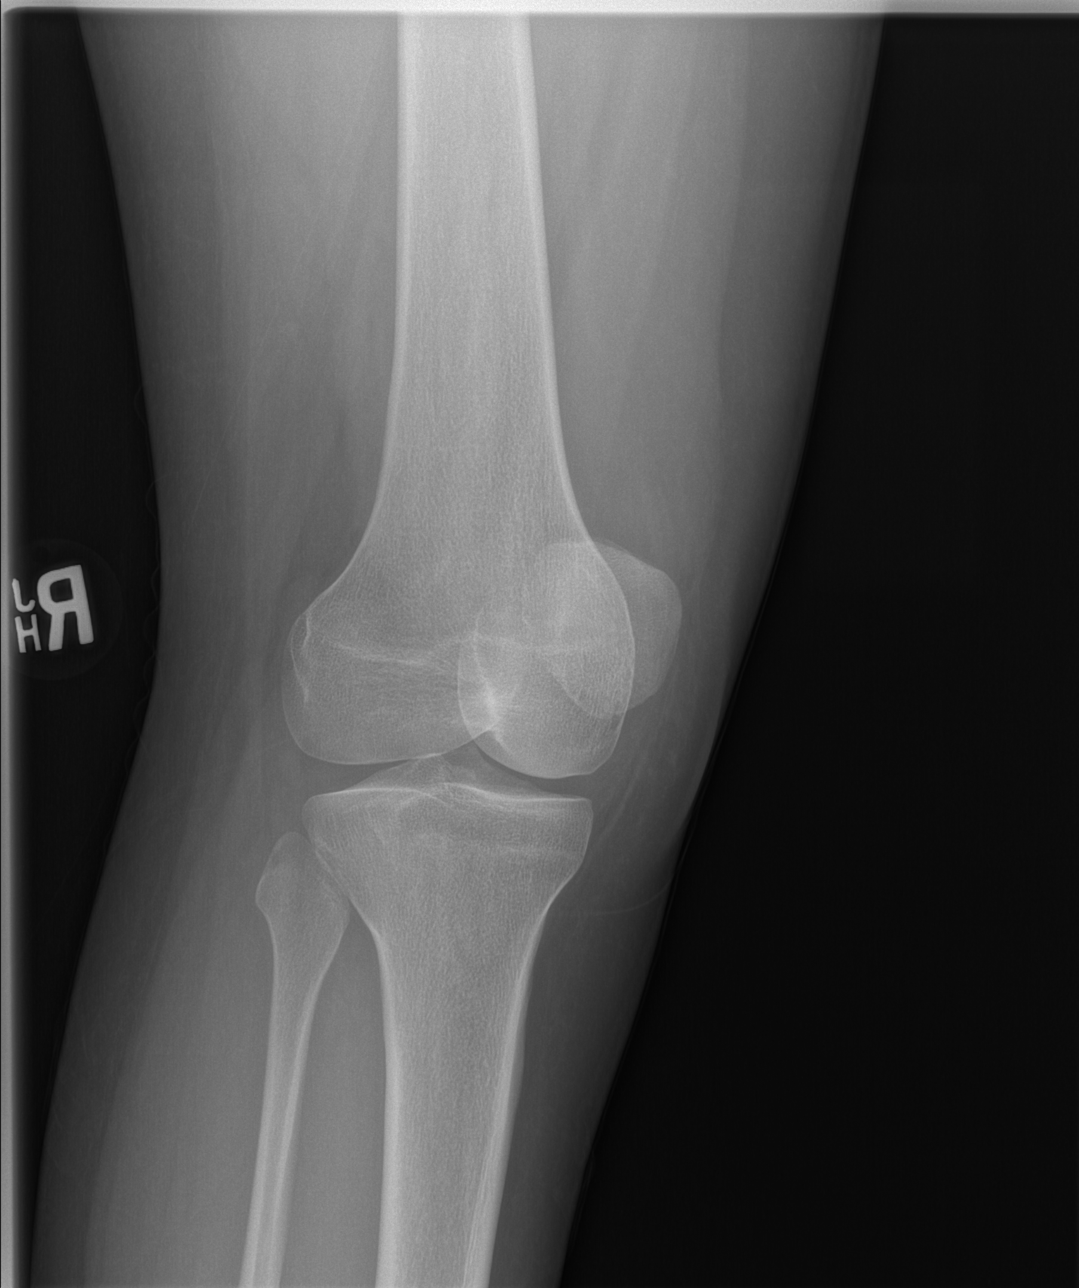

[t knee obl right (2 of 2)]
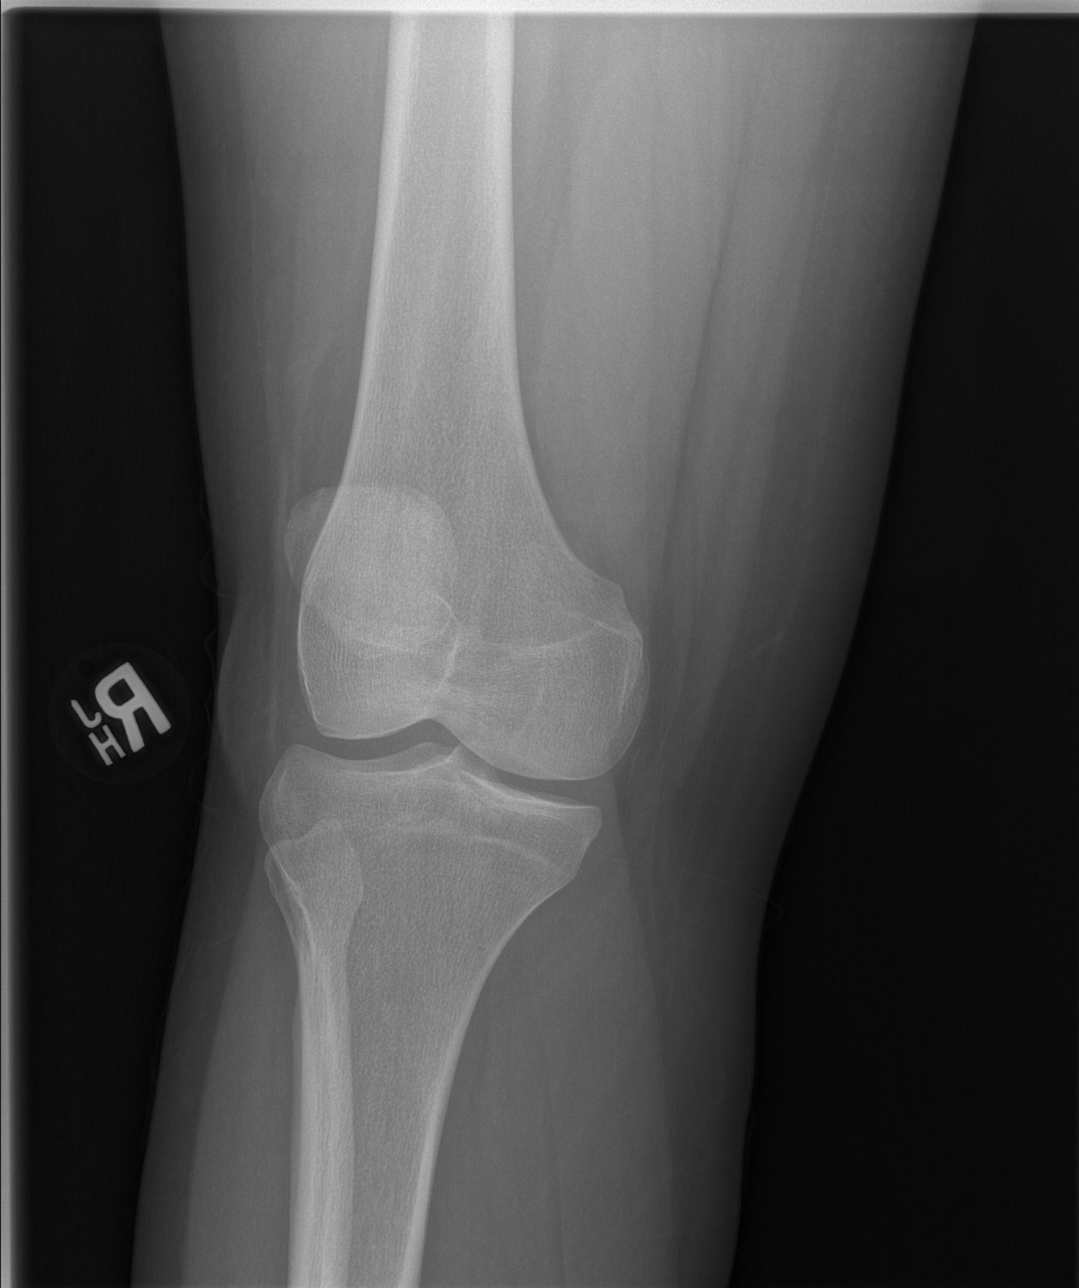

[t knee lat right]
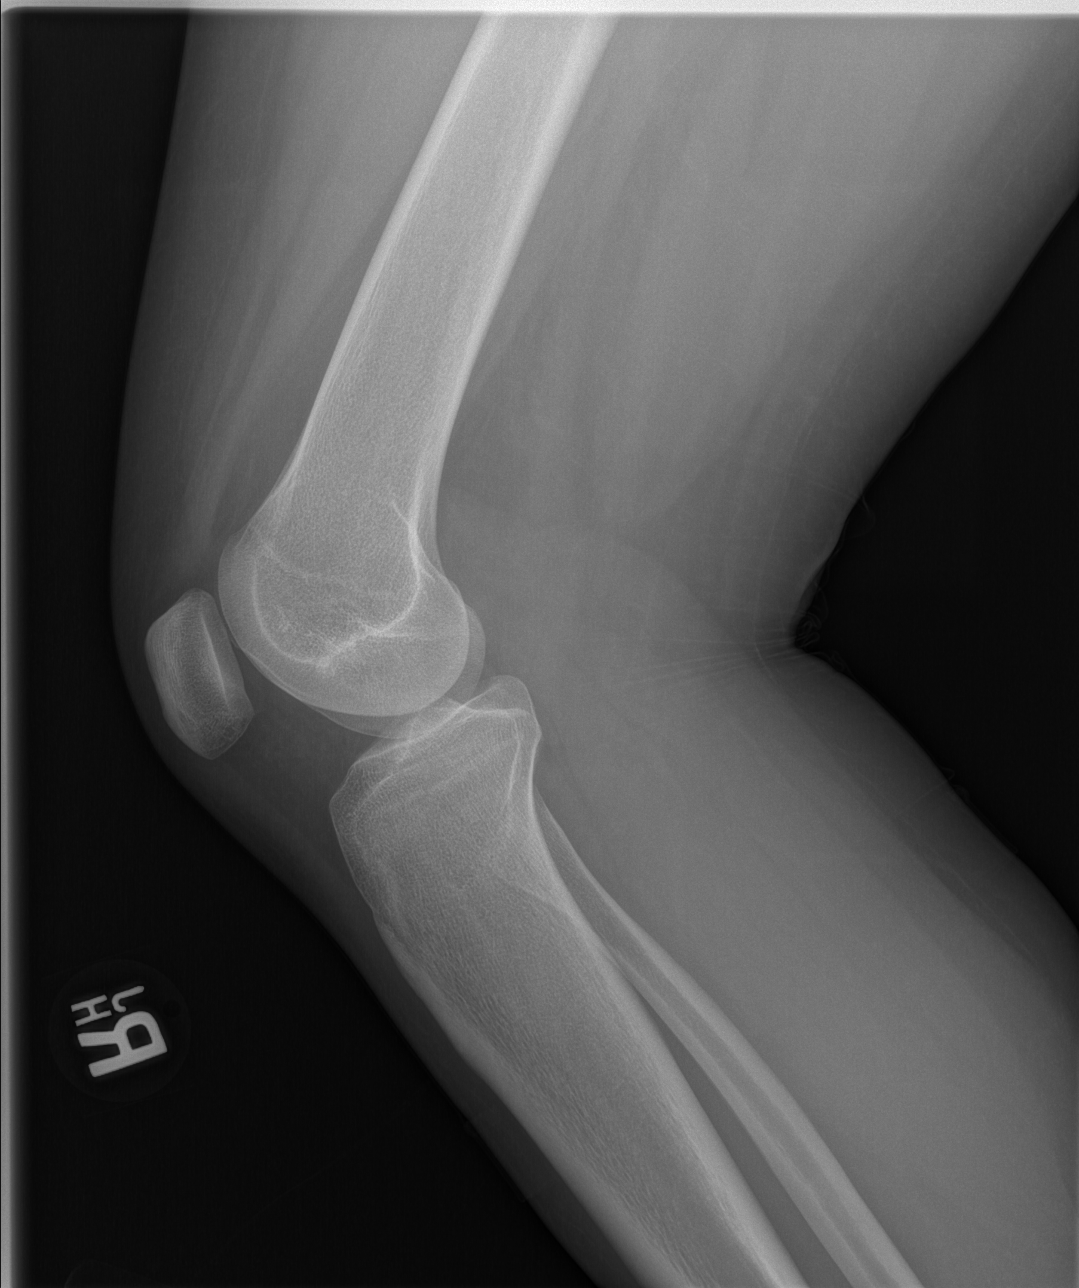

[4 of 4 positions shown; findings below may reference images not displayed]

FINDINGS: No fracture or malalignment. Joint spaces are maintained. No
significant knee effusion
IMPRESSION: Negative.

## 2021-05-14 ENCOUNTER — Ambulatory Visit: Payer: Medicaid Other | Admitting: Internal Medicine

## 2021-05-18 ENCOUNTER — Ambulatory Visit: Payer: Self-pay | Admitting: Internal Medicine

## 2021-05-26 ENCOUNTER — Ambulatory Visit: Payer: Self-pay

## 2021-05-26 NOTE — Telephone Encounter (Signed)
? ?  Chief Complaint: Migraine, in tears. ?Symptoms: Pain with blurry vision ?Frequency: Yesterday ?Pertinent Negatives: Patient denies  ?Disposition: [] ED /[] Urgent Care (no appt availability in office) / [] Appointment(In office/virtual)/ []  [facility]/ [] Home Care/ [] Refused Recommended Disposition /[] [facility]/ []  Follow-up with PCP ?Additional Notes: Asking to be worked in today with any provider. Please advise.  ? ?Answer Assessment - Initial Assessment Questions ?1. LOCATION: "Where does it hurt?"  ?    [place] ?2. ONSET: "When did the headache start?" (Minutes, hours or days)  ?    Yesterday ?3. PATTERN: "Does the pain come and go, or has it been constant since it started?" ?    Comes and goes ?4. SEVERITY: "How bad is the pain?" and "What does it keep you from doing?"  (e.g., Scale 1-10; mild, moderate, or severe) ?  - MILD (1-3): doesn't interfere with normal activities  ?  - MODERATE (4-7): interferes with normal activities or awakens from sleep  ?  - SEVERE (8-10): excruciating pain, unable to do any normal activities    ?    Now - 6 ?5. RECURRENT SYMPTOM: "Have you ever had headaches before?" If Yes, ask: "When was the last time?" and "What happened that time?"  ?    Yes ?6. CAUSE: "What do you think is causing the headache?" ?    Migraines ?7. MIGRAINE: "Have you been diagnosed with migraine headaches?" If Yes, ask: "Is this headache similar?"  ?    Yes ?8. HEAD INJURY: "Has there been any recent injury to the head?"  ?    No ?9. OTHER SYMPTOMS: "Do you have any other symptoms?" (fever, stiff neck, eye pain, sore throat, cold symptoms) ?    Blurred vision ?10. PREGNANCY: "Is there any chance you are pregnant?" "When was your last menstrual period?" ?      No ? ?Protocols used: Headache-A-AH ? ?

## 2021-05-26 NOTE — Telephone Encounter (Signed)
Appointment scheduled for OV 05/27/2021 ?Denies N/D, blurred vision.  ?Does not have migraine medication.  ?Worse when she drinks coffee.  ? ? ?

## 2021-05-27 ENCOUNTER — Ambulatory Visit: Payer: Self-pay | Attending: Physician Assistant | Admitting: Physician Assistant

## 2021-05-27 ENCOUNTER — Encounter: Payer: Self-pay | Admitting: Physician Assistant

## 2021-05-27 VITALS — BP 134/82 | HR 63 | Wt 212.4 lb

## 2021-05-27 DIAGNOSIS — Z131 Encounter for screening for diabetes mellitus: Secondary | ICD-10-CM

## 2021-05-27 DIAGNOSIS — R0981 Nasal congestion: Secondary | ICD-10-CM | POA: Insufficient documentation

## 2021-05-27 DIAGNOSIS — G4489 Other headache syndrome: Secondary | ICD-10-CM | POA: Insufficient documentation

## 2021-05-27 DIAGNOSIS — Z79899 Other long term (current) drug therapy: Secondary | ICD-10-CM | POA: Insufficient documentation

## 2021-05-27 MED ORDER — TOPIRAMATE 25 MG PO TABS: 25.0000 mg | ORAL_TABLET | Freq: Two times a day (BID) | ORAL | 2 refills | Status: DC

## 2021-05-27 MED ORDER — FLUTICASONE PROPIONATE 50 MCG/ACT NA SUSP: 2.0000 | Freq: Every day | NASAL | 6 refills | Status: DC

## 2021-05-27 NOTE — Progress Notes (Signed)
Patient ID: Rachael Hahn, female   DOB: 09-06-1995, 26 y.o.   MRN: [id number] ? ? ?Rachael Hahn, is a 26 y.o. female ? ?[id number] ? ?[id number] ? ?DOB - [birth year] ? ?Chief Complaint  ?Patient presents with  ? Headache  ?    ? ?Subjective:  ? ?Rachael Hahn is a 26 y.o. female here today for 3 times weekly HA ?79months ?Lasts several hours.  Behind both eyes and blurry vision when they occur.  No N/V.  No aura ?Onset:  about 6 months ago ?Location:  temporal and behind B eyes ?Duration:  several hours ?Characterization/quality:  moderate  ?Alleviating/aggravating:  sometimes helped with ibu.  Congestion is making worse ?Radiation:  behind eyes ?Severity:  moderate to severe ? ?No FH early aneurysm or stroke ? ?No problems updated. ? ?ALLERGIES: ?Allergies  ?Allergen Reactions  ? Amoxicillin Other (See Comments)  ?  unknown  ? ? ?PAST MEDICAL HISTORY: ?Past Medical History:  ?Diagnosis Date  ? Depression   ? Hypertension   ? Trichomoniasis   ? ? ?MEDICATIONS AT HOME: ?Prior to Admission medications   ?Medication Sig Start Date End Date Taking? Authorizing Provider  ?fluticasone (FLONASE) 50 MCG/ACT nasal spray Place 2 sprays into both nostrils daily. 05/27/21  Yes Anders Simmonds, PA-C  ?topiramate (TOPAMAX) 25 MG tablet Take 1 tablet (25 mg total) by mouth 2 (two) times daily. For headache prevention 05/27/21  Yes Anders Simmonds, PA-C  ?ibuprofen (ADVIL) 800 MG tablet Take 1 tablet (800 mg total) by mouth every 8 (eight) hours as needed. ?Patient not taking: Reported on 12/10/2019 04/07/19   Charlestine Night, PA-C  ? ? ?ROS: ?Neg resp ?Neg cardiac ?Neg GI ?Neg GU ?Neg MS ?Neg psych ? ? ?Objective:  ? ?Vitals:  ? 05/27/21 1412  ?BP: 134/82  ?Pulse: 63  ?SpO2: 98%  ?Weight: 212 lb 6.4 oz (96.3 kg)  ? ?Exam ?General appearance : Awake, alert, not in any distress. Speech Clear. Not toxic looking ?HEENT: Atraumatic and Normocephalic.  PERRLA>  EOM intact.  B TM congestion.  Nasal turbinates  boggy ?Neck: Supple, no JVD. No cervical lymphadenopathy.  ?Chest: Good air entry bilaterally, CTAB.  No rales/rhonchi/wheezing ?CVS: S1 S2 regular, no murmurs.  ?Extremities: B/L Lower Ext shows no edema, both legs are warm to touch ?Neurology: Awake alert, and oriented X 3, CN II-XII intact, Non focal ?Skin: No Rash ? ?Data Review ?No results found for: HGBA1C ? ?Assessment & Plan  ? ?1. Headache syndrome ?- Ambulatory referral to Neurology ?- Thyroid Panel With TSH ?- Comprehensive metabolic panel ?- CBC with Differential/Platelet ?- fluticasone (FLONASE) 50 MCG/ACT nasal spray; Place 2 sprays into both nostrils daily.  Dispense: 16 g; Refill: 6 ?- topiramate (TOPAMAX) 25 MG tablet; Take 1 tablet (25 mg total) by mouth 2 (two) times daily. For headache prevention  Dispense: 60 tablet; Refill: 2 ?- Vitamin D, 25-hydroxy ? ?2. Congestion of nasal sinus ?- fluticasone (FLONASE) 50 MCG/ACT nasal spray; Place 2 sprays into both nostrils daily.  Dispense: 16 g; Refill: 6 ?- topiramate (TOPAMAX) 25 MG tablet; Take 1 tablet (25 mg total) by mouth 2 (two) times daily. For headache prevention  Dispense: 60 tablet; Refill: 2 ? ?3. Screening for diabetes mellitus ?- Hemoglobin A1c ? ? ? ?Return in about 4 months (around 09/26/2021) for with PCP for recheck HA. ? ?The patient was given clear instructions to go to ER or return to medical center if symptoms don't improve, worsen or new problems develop. The patient  verbalized understanding. The patient was told to call to get lab results if they haven't heard anything in the next week.  ? ? ? ? ?Georgian Co, PA-C ?[facility] ?[place], [place] ?[phone]   ?05/27/2021, 2:26 PM  ? ?

## 2021-05-27 NOTE — Patient Instructions (Signed)
General Headache Without Cause A headache is pain or discomfort felt around the head or neck area. There are many causes and types of headaches. A few common types include: Tension headaches. Migraine headaches. Cluster headaches. Chronic daily headaches. Sometimes, the specific cause of a headache may not be found. Follow these instructions at home: Watch your condition for any changes. Let your health care provider know about them. Take these steps to help with your condition: Managing pain     Take over-the-counter and prescription medicines only as told by your health care provider. Treatment may include medicines for pain that are taken by mouth or applied to the skin. Lie down in a dark, quiet room when you have a headache. Keep lights dim if bright lights bother you or make your headaches worse. If directed, put ice on your head and neck area: Put ice in a plastic bag. Place a towel between your skin and the bag. Leave the ice on for 20 minutes, 2-3 times per day. Remove the ice if your skin turns bright red. This is very important. If you cannot feel pain, heat, or cold, you have a greater risk of damage to the area. If directed, apply heat to the affected area. Use the heat source that your health care provider recommends, such as a moist heat pack or a heating pad. Place a towel between your skin and the heat source. Leave the heat on for 20-30 minutes. Remove the heat if your skin turns bright red. This is especially important if you are unable to feel pain, heat, or cold. You have a greater risk of getting burned. Eating and drinking Eat meals on a regular schedule. If you drink alcohol: Limit how much you have to: 0-1 drink a day for women who are not pregnant. 0-2 drinks a day for men. Know how much alcohol is in a drink. In the [place] one drink equals one 12 oz bottle of beer (355 mL), one 5 oz glass of wine (148 mL), or one 1 oz glass of hard liquor (44 mL). Stop  drinking caffeine, or decrease the amount of caffeine you drink. Drink enough fluid to keep your urine pale yellow. General instructions  Keep a headache journal to help find out what may trigger your headaches. For example, write down: What you eat and drink. How much sleep you get. Any change to your diet or medicines. Try massage or other relaxation techniques. Limit stress. Sit up straight, and do not tense your muscles. Do not use any products that contain nicotine or tobacco. These products include cigarettes, chewing tobacco, and vaping devices, such as e-cigarettes. If you need help quitting, ask your health care provider. Exercise regularly as told by your health care provider. Sleep on a regular schedule. Get 7-9 hours of sleep each night, or the amount recommended by your health care provider. Keep all follow-up visits. This is important. Contact a health care provider if: Medicine does not help your symptoms. You have a headache that is different from your usual headache. You have nausea or you vomit. You have a fever. Get help right away if: Your headache: Becomes severe quickly. Gets worse after moderate to intense physical activity. You have any of these symptoms: Repeated vomiting. Pain or stiffness in your neck. Changes to your vision. Pain in an eye or ear. Problems with speech. Muscular weakness or loss of muscle control. Loss of balance or coordination. You feel faint or pass out. You have confusion. You have   a seizure. These symptoms may represent a serious problem that is an emergency. Do not wait to see if the symptoms will go away. Get medical help right away. Call your local emergency services (911 in the [place] Do not drive yourself to the hospital. Summary A headache is pain or discomfort felt around the head or neck area. There are many causes and types of headaches. In some cases, the cause may not be found. Keep a headache journal to help find out  what may trigger your headaches. Watch your condition for any changes. Let your health care provider know about them. Contact a health care provider if you have a headache that is different from the usual headache, or if your symptoms are not helped by medicine. Get help right away if your headache becomes severe, you vomit, you have a loss of vision, you lose your balance, or you have a seizure. This information is not intended to replace advice given to you by your health care provider. Make sure you discuss any questions you have with your health care provider. Document Revised: 06/24/2020 Document Reviewed: 06/24/2020 Elsevier Patient Education  2023 [employer]  

## 2021-05-28 ENCOUNTER — Encounter: Payer: Self-pay | Admitting: Neurology

## 2021-05-28 LAB — THYROID PANEL WITH TSH
Free Thyroxine Index: 1.8 (ref 1.2–4.9)
T3 Uptake Ratio: 28 % (ref 24–39)
T4, Total: 6.5 ug/dL (ref 4.5–12.0)
TSH: 1.33 u[IU]/mL (ref 0.450–4.500)

## 2021-05-28 LAB — COMPREHENSIVE METABOLIC PANEL
ALT: 19 IU/L (ref 0–32)
AST: 15 IU/L (ref 0–40)
Albumin/Globulin Ratio: 1.6 (ref 1.2–2.2)
Albumin: 4.4 g/dL (ref 3.9–5.0)
Alkaline Phosphatase: 98 IU/L (ref 44–121)
BUN/Creatinine Ratio: 20 (ref 9–23)
BUN: 12 mg/dL (ref 6–20)
Bilirubin Total: <0.2 mg/dL (ref 0.0–1.2)
CO2: 19 mmol/L — ABNORMAL LOW (ref 20–29)
Calcium: 9.6 mg/dL (ref 8.7–10.2)
Chloride: 105 mmol/L (ref 96–106)
Creatinine, Ser: 0.59 mg/dL (ref 0.57–1.00)
Globulin, Total: 2.7 g/dL (ref 1.5–4.5)
Glucose: 63 mg/dL — ABNORMAL LOW (ref 70–99)
Potassium: 4.3 mmol/L (ref 3.5–5.2)
Sodium: 140 mmol/L (ref 134–144)
Total Protein: 7.1 g/dL (ref 6.0–8.5)
eGFR: 130 mL/min/{1.73_m2} (ref >59)

## 2021-05-28 LAB — CBC WITH DIFFERENTIAL/PLATELET
Basophils Absolute: 0 10*3/uL (ref 0.0–0.2)
Basos: 0 %
EOS (ABSOLUTE): 0.1 10*3/uL (ref 0.0–0.4)
Eos: 1 %
Hematocrit: 42.7 % (ref 34.0–46.6)
Hemoglobin: 13.9 g/dL (ref 11.1–15.9)
Immature Grans (Abs): 0 10*3/uL (ref 0.0–0.1)
Immature Granulocytes: 0 %
Lymphocytes Absolute: 2.6 10*3/uL (ref 0.7–3.1)
Lymphs: 54 %
MCH: 28 pg (ref 26.6–33.0)
MCHC: 32.6 g/dL (ref 31.5–35.7)
MCV: 86 fL (ref 79–97)
Monocytes Absolute: 0.3 10*3/uL (ref 0.1–0.9)
Monocytes: 5 %
Neutrophils Absolute: 2 10*3/uL (ref 1.4–7.0)
Neutrophils: 40 %
Platelets: 323 10*3/uL (ref 150–450)
RBC: 4.96 x10E6/uL (ref 3.77–5.28)
RDW: 12.6 % (ref 11.7–15.4)
WBC: 5 10*3/uL (ref 3.4–10.8)

## 2021-05-28 LAB — HEMOGLOBIN A1C
Est. average glucose Bld gHb Est-mCnc: 105 mg/dL
Hgb A1c MFr Bld: 5.3 % (ref 4.8–5.6)

## 2021-05-28 LAB — VITAMIN D 25 HYDROXY (VIT D DEFICIENCY, FRACTURES): Vit D, 25-Hydroxy: 15.6 ng/mL — ABNORMAL LOW (ref 30.0–100.0)

## 2021-05-30 ENCOUNTER — Other Ambulatory Visit: Payer: Self-pay | Admitting: Physician Assistant

## 2021-05-30 MED ORDER — VITAMIN D (ERGOCALCIFEROL) 1.25 MG (50000 UNIT) PO CAPS: 50000.0000 [IU] | ORAL_CAPSULE | ORAL | 0 refills | Status: DC

## 2021-06-03 ENCOUNTER — Other Ambulatory Visit: Payer: Self-pay

## 2021-06-03 ENCOUNTER — Telehealth: Payer: Self-pay | Admitting: Internal Medicine

## 2021-06-03 DIAGNOSIS — G4489 Other headache syndrome: Secondary | ICD-10-CM

## 2021-06-03 DIAGNOSIS — R0981 Nasal congestion: Secondary | ICD-10-CM

## 2021-06-03 MED ORDER — VITAMIN D (ERGOCALCIFEROL) 1.25 MG (50000 UNIT) PO CAPS
50000.0000 [IU] | ORAL_CAPSULE | ORAL | 0 refills | Status: DC
  Filled 2021-06-03: qty 16, 112d supply, fill #0

## 2021-06-03 MED ORDER — FLUTICASONE PROPIONATE 50 MCG/ACT NA SUSP
2.0000 | Freq: Every day | NASAL | 6 refills | Status: DC
  Filled 2021-06-03: qty 16, 30d supply, fill #0

## 2021-06-03 MED ORDER — TOPIRAMATE 25 MG PO TABS
25.0000 mg | ORAL_TABLET | Freq: Two times a day (BID) | ORAL | 2 refills | Status: DC
  Filled 2021-06-03: qty 60, 30d supply, fill #0

## 2021-06-03 NOTE — Telephone Encounter (Signed)
Pt called to report that she cannot afford her recent medications, she does not know the names of them but there are two and they are $160 together. She says she cannot afford them ?

## 2021-06-03 NOTE — Telephone Encounter (Signed)
Medication has been changed to [facility] pharmacy and patient has been informed of cost and when she can pick up the medication. ?

## 2021-06-10 ENCOUNTER — Other Ambulatory Visit: Payer: Self-pay

## 2021-06-13 IMAGING — CR DG CHEST 2V
2 series · 2 of 2 positions shown · non-contrast
Comparison: None.

CLINICAL DATA: Chest pain.

EXAM:
CHEST - 2 VIEW

[w chest pa]
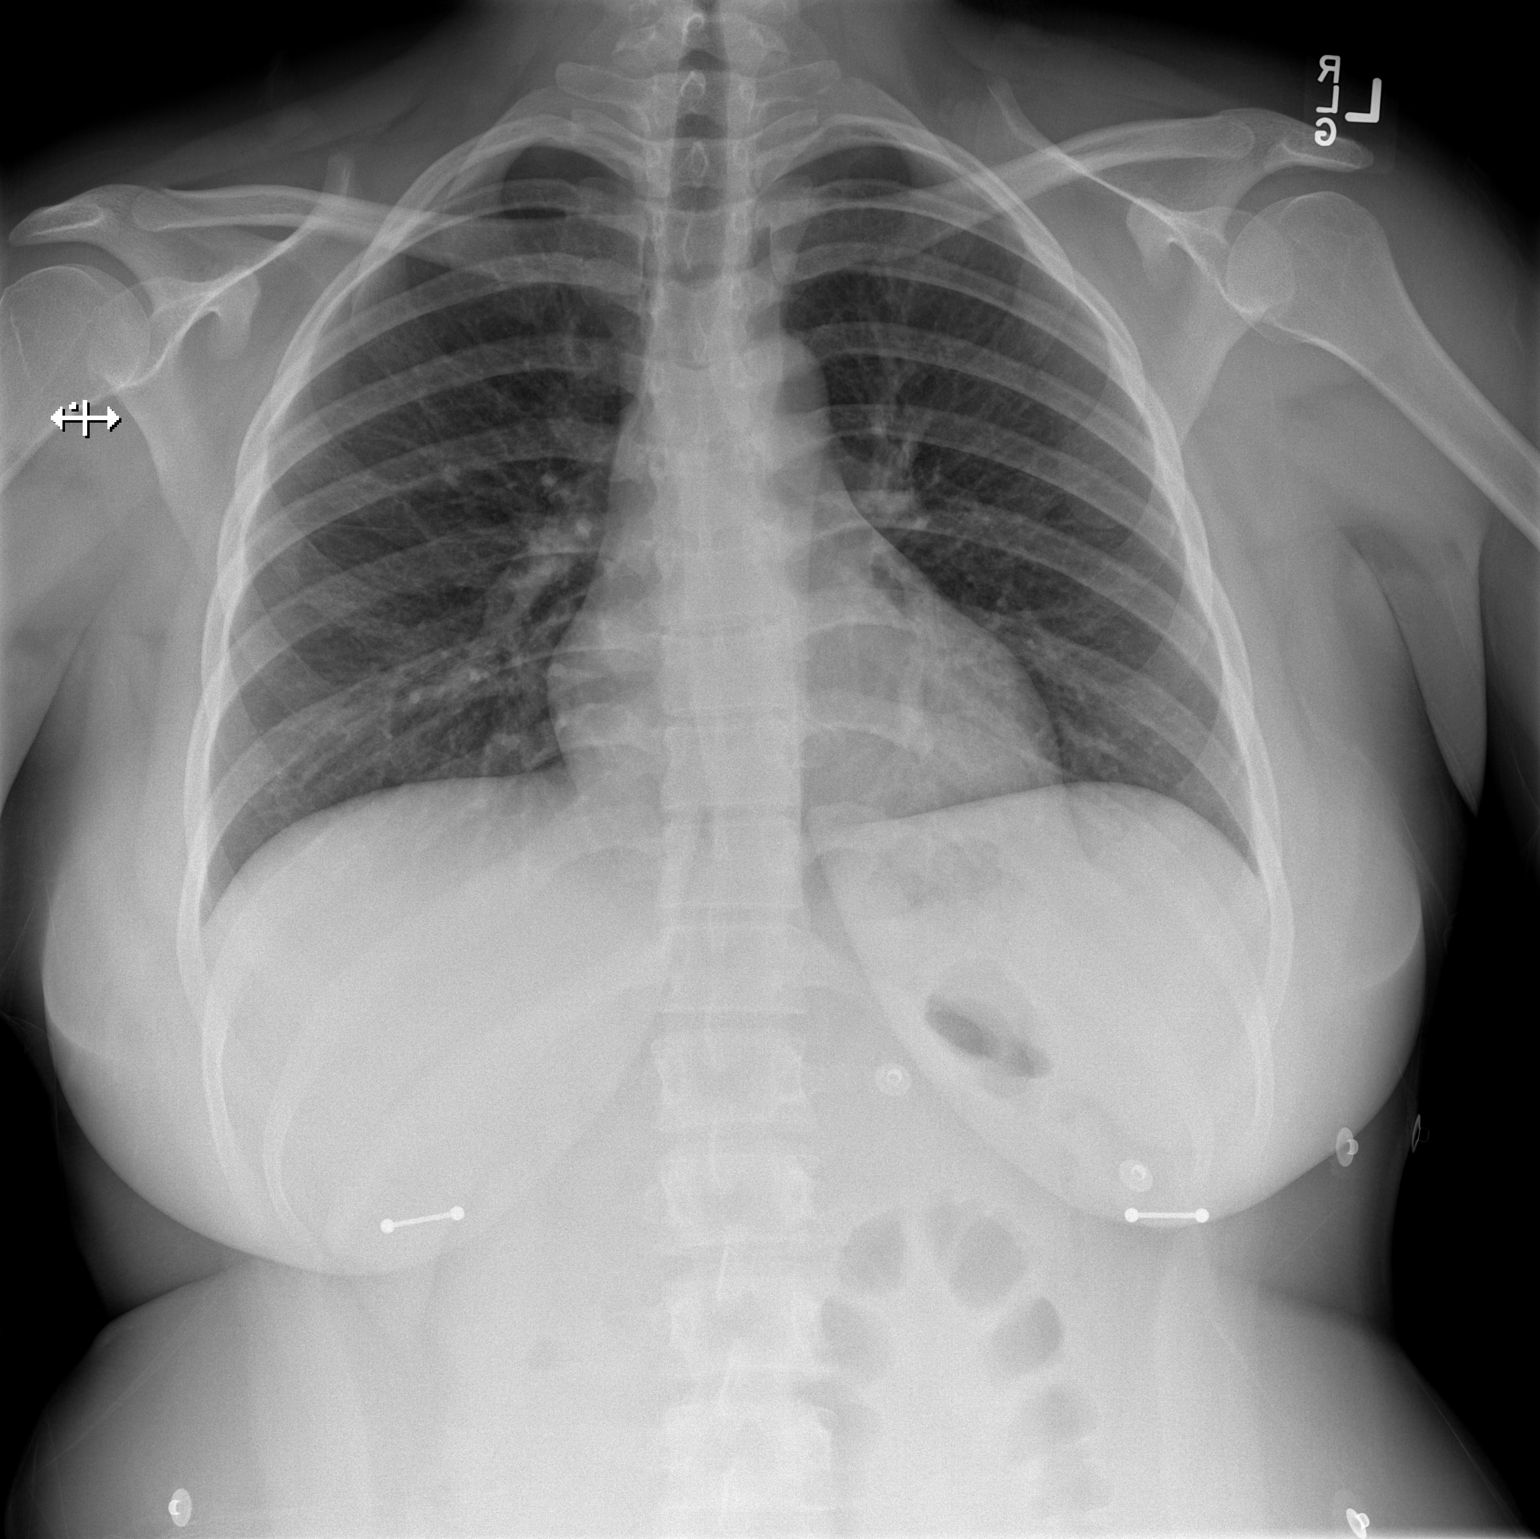

[w chest lat]
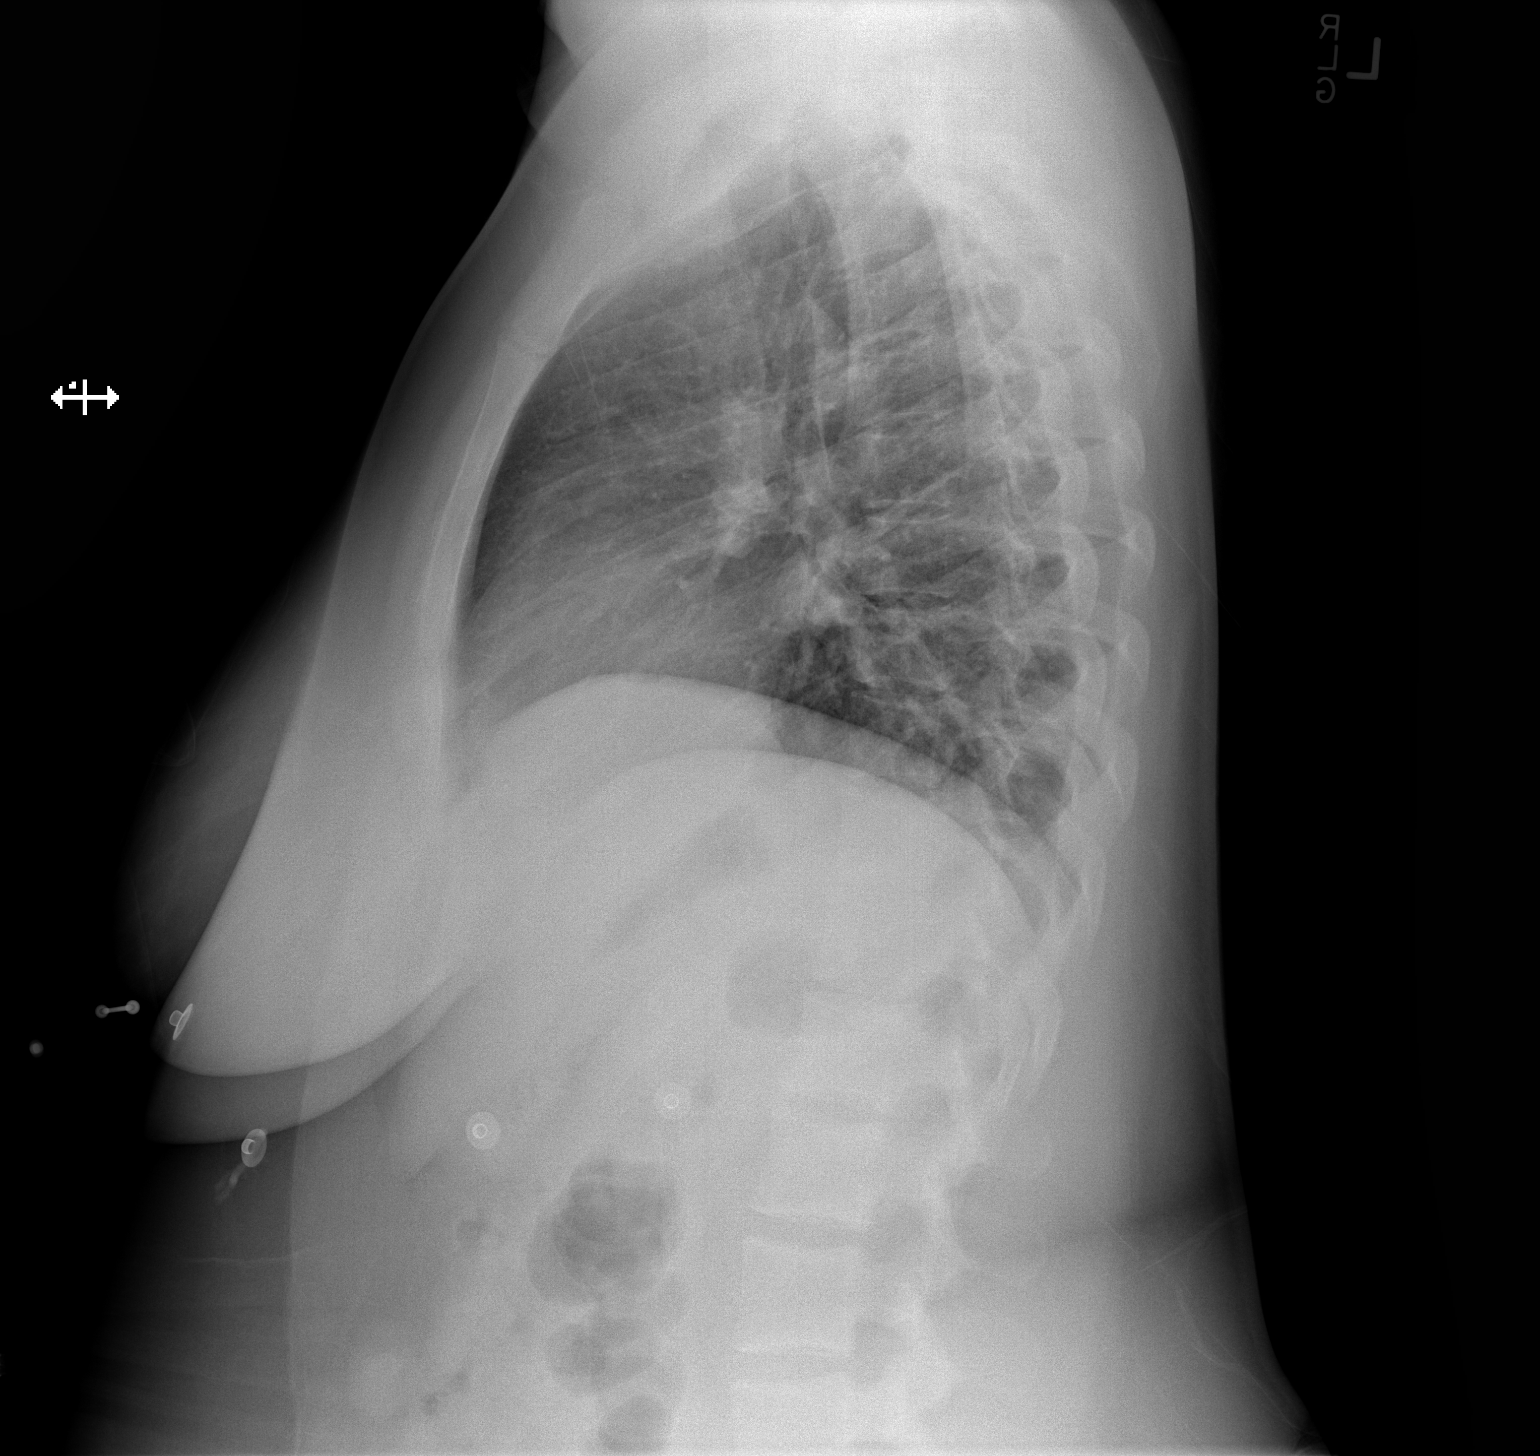

[2 of 2 positions shown; findings below may reference images not displayed]

FINDINGS: The heart size and mediastinal contours are within normal limits.
Both lungs are clear. No pneumothorax or pleural effusion is noted.
The visualized skeletal structures are unremarkable.
IMPRESSION: No active cardiopulmonary disease.

## 2021-09-27 ENCOUNTER — Ambulatory Visit: Payer: Self-pay | Admitting: Internal Medicine

## 2021-10-12 NOTE — Progress Notes (Deleted)
NEUROLOGY CONSULTATION NOTE  Michal Strzelecki MRN: [id number] DOB: 07-18-1995  Referring provider: Georgian Co, PA-C Primary care provider: Jonah Blue, MD  Reason for consult:  headache  Assessment/Plan:   ***   Subjective:  Rachael Hahn is a 26 year old female who presents for headaches.  History supplemented by ED and primary care notes.  Onset:  *** Location:  bilateral temporal and retro-orbital Quality:  *** Intensity:  moderate to severe.   Aura:  absent Prodrome:  absent Associated symptoms:  blurred vision.  She denies associated nausea, vomiting, unilateral numbness or weakness. Duration:  *** Frequency:  *** Frequency of abortive medication: *** Triggers:  *** Relieving factors:  *** Activity:  ***  Past NSAIDS/analgesics:  *** Past abortive triptans:  none Past abortive ergotamine:  none Past muscle relaxants:  none Past anti-emetic:  none Past antihypertensive medications:  *** Past antidepressant medications:  *** Past anticonvulsant medications:  *** Past anti-CGRP:  none Past vitamins/Herbal/Supplements:  none Past antihistamines/decongestants:  none Other past therapies:  ***  Current NSAIDS/analgesics:  ibuprofen 800mg  Current triptans:  none Current ergotamine:  none Current anti-emetic:  none Current muscle relaxants:  none Current Antihypertensive medications:  none Current Antidepressant medications:  none Current Anticonvulsant medications:  topiramate 25mg  BID Current anti-CGRP:  none Current Vitamins/Herbal/Supplements:  D Current Antihistamines/Decongestants:  Flonase Other therapy:  *** Hormone/birth control:  none   Caffeine:  *** Alcohol:  *** Smoker:  *** Diet:  *** Exercise:  *** Depression:  ***; Anxiety:  *** Other pain:  *** Sleep hygiene:  *** Family history of headache:  ***      PAST MEDICAL HISTORY: Past Medical History:  Diagnosis Date   Depression    Hypertension    Trichomoniasis      PAST SURGICAL HISTORY: No past surgical history on file.  MEDICATIONS: Current Outpatient Medications on File Prior to Visit  Medication Sig Dispense Refill   fluticasone (FLONASE) 50 MCG/ACT nasal spray Place 2 sprays into both nostrils daily. 16 g 6   ibuprofen (ADVIL) 800 MG tablet Take 1 tablet (800 mg total) by mouth every 8 (eight) hours as needed. (Patient not taking: Reported on 12/10/2019) 21 tablet 0   topiramate (TOPAMAX) 25 MG tablet Take 1 tablet (25 mg total) by mouth 2 (two) times daily. For headache prevention 60 tablet 2   Vitamin D, Ergocalciferol, (DRISDOL) 1.25 MG (50000 UNIT) CAPS capsule Take 1 capsule (50,000 Units total) by mouth every 7 (seven) days. 16 capsule 0   No current facility-administered medications on file prior to visit.    ALLERGIES: Allergies  Allergen Reactions   Amoxicillin Other (See Comments)    unknown    FAMILY HISTORY: Family History  Problem Relation Age of Onset   Healthy Mother    Healthy Father     Objective:  *** General: No acute distress.  Patient appears well-groomed.   Head:  Normocephalic/atraumatic Eyes:  fundi examined but not visualized Neck: supple, no paraspinal tenderness, full range of motion Back: No paraspinal tenderness Heart: regular rate and rhythm Lungs: Clear to auscultation bilaterally. Vascular: No carotid bruits. Neurological Exam: Mental status: alert and oriented to person, place, and time, speech fluent and not dysarthric, language intact. Cranial nerves: CN I: not tested CN II: pupils equal, round and reactive to light, visual fields intact CN III, IV, VI:  full range of motion, no nystagmus, no ptosis CN V: facial sensation intact. CN VII: upper and lower face symmetric CN VIII: hearing intact CN  IX, X: gag intact, uvula midline CN XI: sternocleidomastoid and trapezius muscles intact CN XII: tongue midline Bulk & Tone: normal, no fasciculations. Motor:  muscle strength 5/5  throughout Sensation:  Pinprick, temperature and vibratory sensation intact. Deep Tendon Reflexes:  2+ throughout,  toes downgoing.   Finger to nose testing:  Without dysmetria.   Heel to shin:  Without dysmetria.   Gait:  Normal station and stride.  Romberg negative.    Thank you for allowing me to take part in the care of this patient.  Shon Millet, DO  CC: ***

## 2021-10-14 ENCOUNTER — Other Ambulatory Visit: Payer: Self-pay

## 2021-10-14 ENCOUNTER — Encounter (HOSPITAL_COMMUNITY): Payer: Self-pay | Admitting: *Deleted

## 2021-10-14 ENCOUNTER — Ambulatory Visit (HOSPITAL_COMMUNITY)
Admission: EM | Admit: 2021-10-14 | Discharge: 2021-10-14 | Disposition: A | Discharge status: Home / Self Care | Payer: Self-pay | Attending: Family Medicine | Admitting: Family Medicine

## 2021-10-14 ENCOUNTER — Encounter: Payer: Self-pay | Admitting: Neurology

## 2021-10-14 ENCOUNTER — Ambulatory Visit: Payer: Self-pay | Admitting: Neurology

## 2021-10-14 DIAGNOSIS — L03312 Cellulitis of back [any part except buttock]: Secondary | ICD-10-CM

## 2021-10-14 DIAGNOSIS — Z029 Encounter for administrative examinations, unspecified: Secondary | ICD-10-CM

## 2021-10-14 MED ORDER — CEPHALEXIN 500 MG PO CAPS
500.0000 mg | ORAL_CAPSULE | Freq: Three times a day (TID) | ORAL | 0 refills | Status: AC
  Filled 2021-10-14: qty 21, 7d supply, fill #0

## 2021-10-14 MED ORDER — KETOROLAC TROMETHAMINE 30 MG/ML IJ SOLN
INTRAMUSCULAR | Status: AC
  Filled 2021-10-14: qty 1

## 2021-10-14 MED ORDER — IBUPROFEN 800 MG PO TABS
800.0000 mg | ORAL_TABLET | Freq: Three times a day (TID) | ORAL | 0 refills | Status: DC | PRN
  Filled 2021-10-14: qty 21, 7d supply, fill #0

## 2021-10-14 MED ORDER — KETOROLAC TROMETHAMINE 30 MG/ML IJ SOLN
30.0000 mg | Freq: Once | INTRAMUSCULAR | Status: AC
  Administered 2021-10-14: 30 mg via INTRAMUSCULAR

## 2021-10-14 NOTE — ED Provider Notes (Addendum)
[facility]    CSN: [id number] Arrival date & time: 10/14/21  1431      History   Chief Complaint Chief Complaint  Patient presents with   Insect Bite    HPI Rachael Hahn is a 26 y.o. female.   HPI Here for a swollen spot on her left upper back that is been painful in the last day or so.  She is unaware of any exposure to spiders or other bugs, but mention to staff it might be a spider bite.  No fever or chills.  Last menstrual cycle is now. She reports a history of allergy to amoxicillin, but she is unaware of what her reaction was.  She states she could not take it when she was young.  She did take one of her friends amoxicillin's 1 time, and had no rash or trouble breathing, but it did give her yeast infection   Past Medical History:  Diagnosis Date   Depression    Hypertension    Trichomoniasis     Patient Active Problem List   Diagnosis Date Noted   Headache syndrome 03/19/2021   Elevated blood pressure reading without diagnosis of hypertension 03/19/2021    History reviewed. No pertinent surgical history.  OB History   No obstetric history on file.      Home Medications    Prior to Admission medications   Medication Sig Start Date End Date Taking? Authorizing Provider  cephALEXin (KEFLEX) 500 MG capsule Take 1 capsule (500 mg total) by mouth 3 (three) times daily for 7 days. 10/14/21 10/21/21 Yes Olinda Nola, Janace Aris, MD  ibuprofen (ADVIL) 800 MG tablet Take 1 tablet (800 mg total) by mouth every 8 (eight) hours as needed (pain). 10/14/21  Yes Zenia Resides, MD  fluticasone (FLONASE) 50 MCG/ACT nasal spray Place 2 sprays into both nostrils daily. 06/03/21   Anders Simmonds, PA-C  topiramate (TOPAMAX) 25 MG tablet Take 1 tablet (25 mg total) by mouth 2 (two) times daily. For headache prevention 06/03/21   Anders Simmonds, PA-C  Vitamin D, Ergocalciferol, (DRISDOL) 1.25 MG (50000 UNIT) CAPS capsule Take 1 capsule (50,000 Units total) by mouth  every 7 (seven) days. 06/03/21   Anders Simmonds, PA-C    Family History Family History  Problem Relation Age of Onset   Healthy Mother    Healthy Father     Social History Social History   Tobacco Use   Smoking status: Never   Smokeless tobacco: Never  Vaping Use   Vaping Use: Never used  Substance Use Topics   Alcohol use: Not Currently   Drug use: Not Currently     Allergies   Amoxicillin   Review of Systems Review of Systems   Physical Exam Triage Vital Signs ED Triage Vitals  Enc Vitals Group     BP 10/14/21 1456 127/82     Pulse Rate 10/14/21 1456 61     Resp 10/14/21 1456 18     Temp 10/14/21 1456 98.3 F (36.8 C)     Temp Source 10/14/21 1456 Oral     SpO2 10/14/21 1456 98 %     Weight --      Height --      Head Circumference --      Peak Flow --      Pain Score 10/14/21 1454 8     Pain Loc --      Pain Edu? --      Excl. in GC? --  No data found.  Updated Vital Signs BP 127/82 (BP Location: Right Arm)   Pulse 61   Temp 98.3 F (36.8 C) (Oral)   Resp 18   LMP 10/12/2021 (Exact Date)   SpO2 98%   Visual Acuity Right Eye Distance:   Left Eye Distance:   Bilateral Distance:    Right Eye Near:   Left Eye Near:    Bilateral Near:     Physical Exam Vitals reviewed.  Constitutional:      General: She is not in acute distress.    Appearance: She is not toxic-appearing.  Eyes:     Extraocular Movements: Extraocular movements intact.     Conjunctiva/sclera: Conjunctivae normal.     Pupils: Pupils are equal, round, and reactive to light.  Cardiovascular:     Rate and Rhythm: Normal rate and regular rhythm.     Heart sounds: No murmur heard. Pulmonary:     Effort: Pulmonary effort is normal. No respiratory distress.     Breath sounds: No stridor. No wheezing, rhonchi or rales.  Musculoskeletal:     Cervical back: Neck supple.  Lymphadenopathy:     Cervical: No cervical adenopathy.  Skin:    Capillary Refill: Capillary refill  takes less than 2 seconds.     Coloration: Skin is not jaundiced or pale.     Comments: There is an area of induration and erythema about 1.5 cm in diameter just to the left of her thoracic spine in the interscapular area.  There is no fluctuance.  Neurological:     General: No focal deficit present.     Mental Status: She is alert and oriented to person, place, and time.  Psychiatric:        Behavior: Behavior normal.      UC Treatments / Results  Labs (all labs ordered are listed, but only abnormal results are displayed) Labs Reviewed - No data to display  EKG   Radiology No results found.  Procedures Procedures (including critical care time)  Medications Ordered in UC Medications  ketorolac (TORADOL) 30 MG/ML injection 30 mg (has no administration in time range)    Initial Impression / Assessment and Plan / UC Course  I have reviewed the triage vital signs and the nursing notes.  Pertinent labs & imaging results that were available during my care of the patient were reviewed by me and considered in my medical decision making (see chart for details).    Cephalosporin and ibuprofen will be sent to treat the pain and the infection.  I discussed with her considering my taking the amoxicillin allergy off her chart, but she is resistant to that idea.       Final Clinical Impressions(s) / UC Diagnoses   Final diagnoses:  Cellulitis of back except buttock     Discharge Instructions      Take cephalexin 250 mg--1 capsule 3 times daily for 7 days  Take ibuprofen 800 mg--1 tab every 8 hours as needed for pain.  You have been given a shot of Toradol 30 mg today.       ED Prescriptions     Medication Sig Dispense Auth. Provider   cephALEXin (KEFLEX) 500 MG capsule Take 1 capsule (500 mg total) by mouth 3 (three) times daily for 7 days. 21 capsule Zenia Resides, MD   ibuprofen (ADVIL) 800 MG tablet Take 1 tablet (800 mg total) by mouth every 8 (eight)  hours as needed (pain). 21 tablet Marlinda Mike, Janace Aris, MD  PDMP not reviewed this encounter.   Zenia Resides, MD 10/14/21 1516    Zenia Resides, MD 10/14/21 [phone]

## 2021-10-14 NOTE — Discharge Instructions (Addendum)
Take cephalexin 250 mg--1 capsule 3 times daily for 7 days  Take ibuprofen 800 mg--1 tab every 8 hours as needed for pain.  You have been given a shot of Toradol 30 mg today.

## 2021-10-14 NOTE — ED Triage Notes (Signed)
Pt states that she was bite by a spider on Sunday on her back. It was hurting bad last night but has got better. No meds have been taken.

## 2021-10-20 ENCOUNTER — Other Ambulatory Visit: Payer: Self-pay

## 2021-12-20 ENCOUNTER — Ambulatory Visit
Admission: RE | Admit: 2021-12-20 | Discharge: 2021-12-20 | Disposition: A | Discharge status: Home / Self Care | Payer: BC Managed Care – PPO | Source: Ambulatory Visit | Attending: Emergency Medicine | Admitting: Emergency Medicine

## 2021-12-20 ENCOUNTER — Ambulatory Visit (HOSPITAL_COMMUNITY): Payer: Self-pay

## 2021-12-20 VITALS — BP 151/106 | HR 70 | Temp 97.7°F | Resp 18

## 2021-12-20 DIAGNOSIS — J329 Chronic sinusitis, unspecified: Secondary | ICD-10-CM | POA: Diagnosis not present

## 2021-12-20 DIAGNOSIS — U071 COVID-19: Secondary | ICD-10-CM | POA: Diagnosis not present

## 2021-12-20 DIAGNOSIS — L739 Follicular disorder, unspecified: Secondary | ICD-10-CM | POA: Insufficient documentation

## 2021-12-20 DIAGNOSIS — I10 Essential (primary) hypertension: Secondary | ICD-10-CM | POA: Insufficient documentation

## 2021-12-20 DIAGNOSIS — B349 Viral infection, unspecified: Secondary | ICD-10-CM

## 2021-12-20 HISTORY — DX: Unspecified osteoarthritis, unspecified site: M19.90

## 2021-12-20 MED ORDER — AMLODIPINE BESYLATE 5 MG PO TABS: 5.0000 mg | ORAL_TABLET | Freq: Every day | ORAL | 2 refills | Status: DC

## 2021-12-20 MED ORDER — CETIRIZINE HCL 10 MG PO TABS: 10.0000 mg | ORAL_TABLET | Freq: Every day | ORAL | 1 refills | Status: DC

## 2021-12-20 MED ORDER — FLUTICASONE PROPIONATE 50 MCG/ACT NA SUSP: 1.0000 | Freq: Every day | NASAL | 1 refills | Status: DC

## 2021-12-20 MED ORDER — DEXAMETHASONE SODIUM PHOSPHATE 10 MG/ML IJ SOLN
10.0000 mg | Freq: Once | INTRAMUSCULAR | Status: AC
  Administered 2021-12-20: 10 mg via INTRAMUSCULAR

## 2021-12-20 MED ORDER — HYDROCHLOROTHIAZIDE 25 MG PO TABS: 25.0000 mg | ORAL_TABLET | Freq: Every morning | ORAL | 2 refills | Status: DC

## 2021-12-20 NOTE — ED Provider Notes (Signed)
[facility]    CSN: [id number] Arrival date & time: 12/20/21  1044    HISTORY   Chief Complaint  Patient presents with   Cough    Entered by patient   Nasal Congestion   Generalized Body Aches   HPI Rachael Hahn is a pleasant, 26 y.o. female who presents to urgent care today. Patient reports a 3-day history of subjective fever, nasal congestion, clear rhinorrhea, body aches, decreased appetite, nonproductive cough and loss of sense of smell, patient states she lost her sense of smell yesterday.  Patient states she also has an area on her back that might be infected.  Patient states has been taking TheraFlu without meaningful relief of her symptoms.  EMR reviewed, Patient was seen and treated for cellulitis on her back in 10/14/21, she was provided with a 7-day course of Keflex and provided with ibuprofen for pain.  Patient has significantly elevated blood pressure on arrival but otherwise has normal vital signs.  The history is provided by the patient.     Past Medical History:  Diagnosis Date   Arthritis    Depression    Hypertension    Trichomoniasis    Patient Active Problem List   Diagnosis Date Noted   Headache syndrome 03/19/2021   Elevated blood pressure reading without diagnosis of hypertension 03/19/2021   History reviewed. No pertinent surgical history. OB History   No obstetric history on file.    Home Medications    Prior to Admission medications   Medication Sig Start Date End Date Taking? Authorizing Provider  fluticasone (FLONASE) 50 MCG/ACT nasal spray Place 2 sprays into both nostrils daily. 06/03/21   Argentina Donovan, PA-C  ibuprofen (ADVIL) 800 MG tablet Take 1 tablet (800 mg total) by mouth every 8 (eight) hours as needed (pain). 10/14/21   Barrett Henle, MD  topiramate (TOPAMAX) 25 MG tablet Take 1 tablet (25 mg total) by mouth 2 (two) times daily. For headache prevention 06/03/21   Argentina Donovan, PA-C  Vitamin D,  Ergocalciferol, (DRISDOL) 1.25 MG (50000 UNIT) CAPS capsule Take 1 capsule (50,000 Units total) by mouth every 7 (seven) days. 06/03/21   Argentina Donovan, PA-C    Family History Family History  Problem Relation Age of Onset   Healthy Mother    Healthy Father    Social History Social History   Tobacco Use   Smoking status: Never   Smokeless tobacco: Never  Vaping Use   Vaping Use: Never used  Substance Use Topics   Alcohol use: Not Currently   Drug use: Not Currently   Allergies   Amoxicillin  Review of Systems Review of Systems Pertinent findings revealed after performing a 14 point review of systems has been noted in the history of present illness.  Physical Exam Triage Vital Signs ED Triage Vitals  Enc Vitals Group     BP 12/04/20 0827 (!) 147/82     Pulse Rate 12/04/20 0827 72     Resp 12/04/20 0827 18     Temp 12/04/20 0827 98.3 F (36.8 C)     Temp Source 12/04/20 0827 Oral     SpO2 12/04/20 0827 98 %     Weight --      Height --      Head Circumference --      Peak Flow --      Pain Score 12/04/20 0826 5     Pain Loc --      Pain Edu? --  Excl. in GC? --   No data found.  Updated Vital Signs BP (!) 151/106 (BP Location: Right Arm)   Pulse 70   Temp 97.7 F (36.5 C) (Oral)   Resp 18   LMP 12/13/2021 (Approximate)   SpO2 97%   Physical Exam Vitals and nursing note reviewed.  Constitutional:      General: She is awake. She is not in acute distress.    Appearance: Normal appearance. She is well-developed. She is morbidly obese. She is ill-appearing.  HENT:     Head: Normocephalic and atraumatic.     Salivary Glands: Right salivary gland is not diffusely enlarged or tender. Left salivary gland is not diffusely enlarged or tender.     Right Ear: Ear canal and external ear normal. No drainage. No middle ear effusion. There is no impacted cerumen. Tympanic membrane is bulging. Tympanic membrane is not injected or erythematous.     Left Ear: Ear  canal and external ear normal. No drainage.  No middle ear effusion. There is no impacted cerumen. Tympanic membrane is bulging. Tympanic membrane is not injected or erythematous.     Ears:     Comments: Bilateral EACs normal, both TMs bulging with clear fluid    Nose: Rhinorrhea present. No nasal deformity, septal deviation, signs of injury, laceration, nasal tenderness, mucosal edema or congestion. Rhinorrhea is clear.     Right Nostril: Occlusion present. No foreign body, epistaxis or septal hematoma.     Left Nostril: Occlusion present. No foreign body, epistaxis or septal hematoma.     Right Turbinates: Enlarged and swollen. Not pale.     Left Turbinates: Enlarged and swollen. Not pale.     Right Sinus: No maxillary sinus tenderness or frontal sinus tenderness.     Left Sinus: No maxillary sinus tenderness or frontal sinus tenderness.     Mouth/Throat:     Lips: Pink. No lesions.     Mouth: Mucous membranes are moist. No oral lesions.     Pharynx: Oropharynx is clear. Uvula midline. No pharyngeal swelling, oropharyngeal exudate, posterior oropharyngeal erythema or uvula swelling.     Tonsils: No tonsillar exudate. 0 on the right. 0 on the left.     Comments: +Postnasal drip Eyes:     General: Lids are normal.        Right eye: No discharge.        Left eye: No discharge.     Extraocular Movements: Extraocular movements intact.     Conjunctiva/sclera: Conjunctivae normal.     Right eye: Right conjunctiva is not injected.     Left eye: Left conjunctiva is not injected.     Pupils: Pupils are equal, round, and reactive to light.  Neck:     Trachea: Trachea and phonation normal.  Cardiovascular:     Rate and Rhythm: Normal rate and regular rhythm.     Pulses: Normal pulses.     Heart sounds: Normal heart sounds. No murmur heard.    No friction rub. No gallop.  Pulmonary:     Effort: Pulmonary effort is normal. No accessory muscle usage, prolonged expiration or respiratory distress.      Breath sounds: Normal breath sounds. No stridor, decreased air movement or transmitted upper airway sounds. No decreased breath sounds, wheezing, rhonchi or rales.  Chest:     Chest wall: No tenderness.  Musculoskeletal:        General: Normal range of motion.     Cervical back: Full passive range of motion  without pain, normal range of motion and neck supple. Normal range of motion.  Lymphadenopathy:     Cervical: No cervical adenopathy.     Right cervical: No superficial, deep or posterior cervical adenopathy.    Left cervical: No superficial, deep or posterior cervical adenopathy.  Skin:    General: Skin is warm and dry.     Findings: No erythema or rash.  Neurological:     General: No focal deficit present.     Mental Status: She is alert and oriented to person, place, and time.  Psychiatric:        Mood and Affect: Mood normal.        Behavior: Behavior normal. Behavior is cooperative.     Visual Acuity Right Eye Distance:   Left Eye Distance:   Bilateral Distance:    Right Eye Near:   Left Eye Near:    Bilateral Near:     [facility] Couse / Diagnostics / Procedures:     Radiology No results found.  Procedures Procedures (including critical care time) EKG  Pending results:  Labs Reviewed  SARS CORONAVIRUS 2 (TAT 6-24 HRS)    Medications Ordered in UC: Medications  dexamethasone (DECADRON) injection 10 mg (has no administration in time range)    UC Diagnoses / Final Clinical Impressions(s)   I have reviewed the triage vital signs and the nursing notes.  Pertinent labs & imaging results that were available during my care of the patient were reviewed by me and considered in my medical decision making (see chart for details).    Final diagnoses:  Essential hypertension  Viral illness  Folliculitis  Rhinosinusitis   ***  ED Prescriptions     Medication Sig Dispense Auth. Provider   amLODipine (NORVASC) 5 MG tablet Take 1 tablet (5 mg total) by mouth  daily. 30 tablet Lynden Oxford Scales, PA-C   hydrochlorothiazide (HYDRODIURIL) 25 MG tablet Take 1 tablet (25 mg total) by mouth in the morning. 30 tablet Lynden Oxford Scales, PA-C   fluticasone (FLONASE) 50 MCG/ACT nasal spray Place 1 spray into both nostrils daily. 47.4 mL Lynden Oxford Scales, PA-C   cetirizine (ZYRTEC ALLERGY) 10 MG tablet Take 1 tablet (10 mg total) by mouth at bedtime. 90 tablet Lynden Oxford Scales, [place]      PDMP not reviewed this encounter.  Disposition Upon Discharge:  Condition: stable for discharge home Home: take medications as prescribed; routine discharge instructions as discussed; follow up as advised.  Patient presented with an acute illness with associated systemic symptoms and significant discomfort requiring urgent management. In my opinion, this is a condition that a prudent lay person (someone who possesses an average knowledge of health and medicine) may potentially expect to result in complications if not addressed urgently such as respiratory distress, impairment of bodily function or dysfunction of bodily organs.   Routine symptom specific, illness specific and/or disease specific instructions were discussed with the patient and/or caregiver at length.   As such, the patient has been evaluated and assessed, work-up was performed and treatment was provided in alignment with urgent care protocols and evidence based medicine.  Patient/parent/caregiver has been advised that the patient may require follow up for further testing and treatment if the symptoms continue in spite of treatment, as clinically indicated and appropriate.  If the patient was tested for COVID-19, Influenza and/or RSV, then the patient/parent/guardian was advised to isolate at home pending the results of his/her diagnostic coronavirus test and potentially longer if they're positive. I have  also advised pt that if his/her COVID-19 test returns positive, it's recommended to  self-isolate for at least 10 days after symptoms first appeared AND until fever-free for 24 hours without fever reducer AND other symptoms have improved or resolved. Discussed self-isolation recommendations as well as instructions for household member/close contacts as per the [facility] and [place] [facility], and also gave patient the COVID packet with this information.  Patient/parent/caregiver has been advised to return to the [facility] or PCP in 3-5 days if no better; to PCP or the Emergency Department if new signs and symptoms develop, or if the current signs or symptoms continue to change or worsen for further workup, evaluation and treatment as clinically indicated and appropriate  The patient will follow up with their current PCP if and as advised. If the patient does not currently have a PCP we will assist them in obtaining one.   The patient may need specialty follow up if the symptoms continue, in spite of conservative treatment and management, for further workup, evaluation, consultation and treatment as clinically indicated and appropriate.  Patient/parent/caregiver verbalized understanding and agreement of plan as discussed.  All questions were addressed during visit.  Please see discharge instructions below for further details of plan.  Discharge Instructions:   Discharge Instructions      Hypertension is commonly known as "the silent killer".  This is because most people feel just fine until they don't and things can often change very quickly without warning.  Prolonged hypertension that is incompletely treated can lead to even worse outcomes such as heart attack, heart failure, stroke, kidney failure, blindness, erectile dysfunction.   When you come to medical visits without taking your blood pressure medication, your provider has no idea whether or not the current medication you are taking is getting your blood pressure to a therapeutic level and is therefore unable to make any adjustments to your  regimen to improve your long-term outcomes.    It is very important that you are consistent with taking your blood pressure medications every day at the same time and to especially always be sure that you take your blood pressure medicine prior to any visit to a medical provider.   Please begin amlodipine 5 mg once daily and hydrochlorothiazide 25 mg once daily, please take both in the morning.  You can take these medications with or without food as you prefer.  Amlodipine will relax the muscles that constrict the arteries in your arms and legs and allow blood to flow more freely and hydrochlorothiazide helps your body elevated excess fluid by making you pee more.     You received a COVID-19 PCR test today.  The result of your COVID-19 test will be posted to your MyChart once it is complete, typically this takes 24 to 36 hours.     If your COVID-19 PCR test is positive, you will be contacted by phone.  Because you do not have a history of being immune compromised, you are currently vaccinated for COVID-19, you are under the age of 54, you do not have a risk of severe disease due to COVID-19, antiviral treatment is not indicated.    If your COVID-19 PCR test is negative, please consider retesting in the next 2 to 3 days, particularly if you are not feeling any better.  You are welcome to return here to urgent care to have it done or you can take a home COVID-19 test.  If both COVID-19 tests are negative, then your illness is likely  due to one of the many less serious illnesses that are circulating in our community right now.  Conservative care is recommended with rest, drinking plenty of clear fluids, eating only when hungry, eating supportive medications for your symptoms and avoiding being around other people.  Please remain at home until you are fever free for 24 hours without the use of antifever medications such as Tylenol and ibuprofen.    Based on my physical exam findings and the history you  have provided  today, I do not recommend antibiotics at this time.  I do not believe the risks and side effects of antibiotics would outweigh any minimal benefit that they might provide.         Your symptoms and physical exam findings are concerning for a viral respiratory infection.  Because respiratory allergies are not well controlled at this time, this makes you more susceptible to catching respiratory infections.   Please see the list below for recommended medications, dosages and frequencies to provide relief of current symptoms:     Decadron IM (dexamethasone):  To quickly address your significant respiratory inflammation, you were provided with an injection of Decadron in the office today.  You should continue to feel the full benefit of the steroid for the next 12-24 hours.    Zyrtec (cetirizine): This is an excellent second-generation antihistamine that helps to reduce respiratory inflammatory response to environmental allergens.  In some patients, this medication can cause daytime sleepiness so I recommend that you give your child this medication at bedtime every day.     Flonase (fluticasone): This is a steroid nasal spray that you use once daily, 1 spray in each nare.  This medication does not work well if you decide to use it only used as you feel you need to, it works best used on a daily basis.  After 3 to 5 days of use, you will notice significant reduction of the inflammation and mucus production that is currently being caused by exposure to allergens, whether seasonal or environmental.  The most common side effect of this medication is nosebleeds.  If you experience a nosebleed, please discontinue use for 1 week, then feel free to resume.  I have provided you with a prescription.     Advil, Motrin (ibuprofen): This is a good anti-inflammatory medication which not only addresses aches, pains but also significantly reduces soft tissue inflammation of the upper airways that causes sinus  and nasal congestion as well as inflammation of the lower airways which makes you feel like your breathing is constricted or your cough feel tight.  I recommend that you take 400 mg every 8 hours as needed.      If you find that your health insurance will not pay for allergy medications, please consider downloading the GoodRx app and using to get a better price than the "off the shelf" price.      Please apply warm compress to the lesion on your back to see if you can bring it up to ahead and hopefully get it to resolve on its own.  If you notice that the area is becoming more red, more swollen and more painful, please return for repeat evaluation.  Antibiotics may be needed at that point.  At this time, the risks of antibiotics do not exceed any minimal benefit they may provide due to the small size of the lesion and it only having been present for about a day.  Please follow-up within the next 5-7 days either  with your primary care provider or urgent care if your symptoms do not show any meaningful improvement.  We have assisted you in finding a primary care provider today.  Please be sure you keep your first appointment for follow-up of your hypertension, allergies and the lesion on your back.        Thank you for visiting urgent care today.  We appreciate the opportunity to participate in your care.         This office note has been dictated using Museum/gallery curator.  Unfortunately, this method of dictation can sometimes lead to typographical or grammatical errors.  I apologize for your inconvenience in advance if this occurs.  Please do not hesitate to reach out to me if clarification is needed.

## 2021-12-20 NOTE — Discharge Instructions (Addendum)
Hypertension is commonly known as "the silent killer".  This is because most people feel just fine until they don't and things can often change very quickly without warning.  Prolonged hypertension that is incompletely treated can lead to even worse outcomes such as heart attack, heart failure, stroke, kidney failure, blindness, erectile dysfunction.   When you come to medical visits without taking your blood pressure medication, your provider has no idea whether or not the current medication you are taking is getting your blood pressure to a therapeutic level and is therefore unable to make any adjustments to your regimen to improve your long-term outcomes.    It is very important that you are consistent with taking your blood pressure medications every day at the same time and to especially always be sure that you take your blood pressure medicine prior to any visit to a medical provider.   Please begin amlodipine 5 mg once daily and hydrochlorothiazide 25 mg once daily, please take both in the morning.  You can take these medications with or without food as you prefer.  Amlodipine will relax the muscles that constrict the arteries in your arms and legs and allow blood to flow more freely and hydrochlorothiazide helps your body elevated excess fluid by making you pee more.     You received a COVID-19 PCR test today.  The result of your COVID-19 test will be posted to your MyChart once it is complete, typically this takes 24 to 36 hours.     If your COVID-19 PCR test is positive, you will be contacted by phone.  Because you do not have a history of being immune compromised, you are currently vaccinated for COVID-19, you are under the age of 18, you do not have a risk of severe disease due to COVID-19, antiviral treatment is not indicated.    If your COVID-19 PCR test is negative, please consider retesting in the next 2 to 3 days, particularly if you are not feeling any better.  You are welcome to  return here to urgent care to have it done or you can take a home COVID-19 test.  If both COVID-19 tests are negative, then your illness is likely due to one of the many less serious illnesses that are circulating in our community right now.  Conservative care is recommended with rest, drinking plenty of clear fluids, eating only when hungry, eating supportive medications for your symptoms and avoiding being around other people.  Please remain at home until you are fever free for 24 hours without the use of antifever medications such as Tylenol and ibuprofen.    Based on my physical exam findings and the history you have provided  today, I do not recommend antibiotics at this time.  I do not believe the risks and side effects of antibiotics would outweigh any minimal benefit that they might provide.         Your symptoms and physical exam findings are concerning for a viral respiratory infection.  Because respiratory allergies are not well controlled at this time, this makes you more susceptible to catching respiratory infections.   Please see the list below for recommended medications, dosages and frequencies to provide relief of current symptoms:     Decadron IM (dexamethasone):  To quickly address your significant respiratory inflammation, you were provided with an injection of Decadron in the office today.  You should continue to feel the full benefit of the steroid for the next 12-24 hours.    Zyrtec (  cetirizine): This is an excellent second-generation antihistamine that helps to reduce respiratory inflammatory response to environmental allergens.  In some patients, this medication can cause daytime sleepiness so I recommend that you give your child this medication at bedtime every day.     Flonase (fluticasone): This is a steroid nasal spray that you use once daily, 1 spray in each nare.  This medication does not work well if you decide to use it only used as you feel you need to, it works best  used on a daily basis.  After 3 to 5 days of use, you will notice significant reduction of the inflammation and mucus production that is currently being caused by exposure to allergens, whether seasonal or environmental.  The most common side effect of this medication is nosebleeds.  If you experience a nosebleed, please discontinue use for 1 week, then feel free to resume.  I have provided you with a prescription.     Advil, Motrin (ibuprofen): This is a good anti-inflammatory medication which not only addresses aches, pains but also significantly reduces soft tissue inflammation of the upper airways that causes sinus and nasal congestion as well as inflammation of the lower airways which makes you feel like your breathing is constricted or your cough feel tight.  I recommend that you take 400 mg every 8 hours as needed.      If you find that your health insurance will not pay for allergy medications, please consider downloading the GoodRx app and using to get a better price than the "off the shelf" price.      Please apply warm compress to the lesion on your back to see if you can bring it up to ahead and hopefully get it to resolve on its own.  If you notice that the area is becoming more red, more swollen and more painful, please return for repeat evaluation.  Antibiotics may be needed at that point.  At this time, the risks of antibiotics do not exceed any minimal benefit they may provide due to the small size of the lesion and it only having been present for about a day.  Please follow-up within the next 5-7 days either with your primary care provider or urgent care if your symptoms do not show any meaningful improvement.  We have assisted you in finding a primary care provider today.  Please be sure you keep your first appointment for follow-up of your hypertension, allergies and the lesion on your back.        Thank you for visiting urgent care today.  We appreciate the opportunity to participate  in your care.

## 2021-12-20 NOTE — ED Triage Notes (Signed)
Pt c/o fever that began Friday, nasal congestion, body aches, poor appetite, and lack of smell that began yesterday.   The patient states she has an area on her back that is possibly infected.   Home interventions: Theraflu

## 2021-12-21 LAB — SARS CORONAVIRUS 2 (TAT 6-24 HRS): SARS Coronavirus 2: POSITIVE — AB

## 2022-02-15 ENCOUNTER — Telehealth: Payer: BC Managed Care – PPO

## 2022-02-15 ENCOUNTER — Telehealth: Payer: BC Managed Care – PPO | Admitting: Physician Assistant

## 2022-02-15 ENCOUNTER — Ambulatory Visit: Payer: Self-pay

## 2022-02-15 DIAGNOSIS — G43809 Other migraine, not intractable, without status migrainosus: Secondary | ICD-10-CM

## 2022-02-15 MED ORDER — SUMATRIPTAN SUCCINATE 50 MG PO TABS: 50.0000 mg | ORAL_TABLET | ORAL | 0 refills | Status: DC | PRN

## 2022-02-15 NOTE — Patient Instructions (Signed)
[name], thank you for joining Margaretann Loveless, PA-C for today's virtual visit.  While this provider is not your primary care provider (PCP), if your PCP is located in our provider database this encounter information will be shared with them immediately following your visit.   A [facility] MyChart account gives you access to today's visit and all your visits, tests, and labs performed at [facility] " click here if you don't have a [facility] MyChart account or go to [email or web address]  Consent: (Patient) Rachael Hahn provided verbal consent for this virtual visit at the beginning of the encounter.  Current Medications:  Current Outpatient Medications:    SUMAtriptan (IMITREX) 50 MG tablet, Take 1 tablet (50 mg total) by mouth every 2 (two) hours as needed for migraine. May repeat in 2 hours if headache persists or recurs., Disp: 10 tablet, Rfl: 0   amLODipine (NORVASC) 5 MG tablet, Take 1 tablet (5 mg total) by mouth daily., Disp: 30 tablet, Rfl: 2   cetirizine (ZYRTEC ALLERGY) 10 MG tablet, Take 1 tablet (10 mg total) by mouth at bedtime., Disp: 90 tablet, Rfl: 1   fluticasone (FLONASE) 50 MCG/ACT nasal spray, Place 1 spray into both nostrils daily., Disp: 47.4 mL, Rfl: 1   hydrochlorothiazide (HYDRODIURIL) 25 MG tablet, Take 1 tablet (25 mg total) by mouth in the morning., Disp: 30 tablet, Rfl: 2   ibuprofen (ADVIL) 800 MG tablet, Take 1 tablet (800 mg total) by mouth every 8 (eight) hours as needed (pain)., Disp: 21 tablet, Rfl: 0   topiramate (TOPAMAX) 25 MG tablet, Take 1 tablet (25 mg total) by mouth 2 (two) times daily. For headache prevention, Disp: 60 tablet, Rfl: 2   Vitamin D, Ergocalciferol, (DRISDOL) 1.25 MG (50000 UNIT) CAPS capsule, Take 1 capsule (50,000 Units total) by mouth every 7 (seven) days., Disp: 16 capsule, Rfl: 0   Medications ordered in this encounter:  Meds ordered this encounter  Medications   SUMAtriptan (IMITREX) 50 MG  tablet    Sig: Take 1 tablet (50 mg total) by mouth every 2 (two) hours as needed for migraine. May repeat in 2 hours if headache persists or recurs.    Dispense:  10 tablet    Refill:  0    Order Specific Question:   Supervising Provider    Answer:   Merrilee Jansky [id number]     *If you need refills on other medications prior to your next appointment, please contact your pharmacy*  Follow-Up: Call back or seek an in-person evaluation if the symptoms worsen or if the condition fails to improve as anticipated.   Virtual Care [phone]  Other Instructions  Sumatriptan Tablets What is this medication? SUMATRIPTAN (soo ma TRIP tan) treats migraines. It works by blocking pain signals and narrowing blood vessels in the brain. It belongs to a group of medications called triptans. It is not used to prevent migraines. This medicine may be used for other purposes; ask your health care provider or pharmacist if you have questions. COMMON BRAND NAME(S): Imitrex, Migraine Pack What should I tell my care team before I take this medication? They need to know if you have any of these conditions: Cigarette smoker Circulation problems in fingers and toes Heart disease High blood pressure High blood sugar (diabetes) High cholesterol History of irregular heartbeat History of stroke Kidney disease Liver disease Stomach or intestine problems An unusual or allergic reaction to sumatriptan, other medications, foods, dyes, or preservatives Pregnant or trying to get  pregnant Breast-feeding How should I use this medication? Take this medication by mouth with a glass of water. Follow the directions on the prescription label. Do not take it more often than directed. Talk to your care team regarding the use of this medication in children. Special care may be needed. Overdosage: If you think you have taken too much of this medicine contact a poison control center or emergency room at  once. NOTE: This medicine is only for you. Do not share this medicine with others. What if I miss a dose? This does not apply. This medication is not for regular use. What may interact with this medication? Do not take this medication with any of the following: Certain medications for migraine headache like almotriptan, eletriptan, frovatriptan, naratriptan, rizatriptan, sumatriptan, zolmitriptan Ergot alkaloids like dihydroergotamine, ergonovine, ergotamine, methylergonovine MAOIs like Carbex, Eldepryl, Marplan, Nardil, and Parnate This medication may also interact with the following: Certain medications for depression, anxiety, or psychotic disorders This list may not describe all possible interactions. Give your health care provider a list of all the medicines, herbs, non-prescription drugs, or dietary supplements you use. Also tell them if you smoke, drink alcohol, or use illegal drugs. Some items may interact with your medicine. What should I watch for while using this medication? Visit your care team for regular checks on your progress. Tell your care team if your symptoms do not start to get better or if they get worse. You may get drowsy or dizzy. Do not drive, use machinery, or do anything that needs mental alertness until you know how this medication affects you. Do not stand up or sit up quickly, especially if you are an older patient. This reduces the risk of dizzy or fainting spells. Alcohol may interfere with the effect of this medication. Tell your care team right away if you have any change in your eyesight. If you take migraine medications for 10 or more days a month, your migraines may get worse. Keep a diary of headache days and medication use. Contact your care team if your migraine attacks occur more frequently. What side effects may I notice from receiving this medication? Side effects that you should report to your care team as soon as possible: Allergic reactions--skin  rash, itching, hives, swelling of the face, lips, tongue, or throat Burning, pain, tingling, or color changes in the legs or feet Heart attack--pain or tightness in the chest, shoulders, arms, or jaw, nausea, shortness of breath, cold or clammy skin, feeling faint or lightheaded Heart rhythm changes--fast or irregular heartbeat, dizziness, feeling faint or lightheaded, chest pain, trouble breathing Increase in blood pressure Raynaud's--cool, numb, or painful fingers or toes that may change color from pale, to blue, to red Seizures Serotonin syndrome--irritability, confusion, fast or irregular heartbeat, muscle stiffness, twitching muscles, sweating, high fever, seizure, chills, vomiting, diarrhea Stroke--sudden numbness or weakness of the face, arm, or leg, trouble speaking, confusion, trouble walking, loss of balance or coordination, dizziness, severe headache, change in vision Sudden or severe stomach pain, nausea, vomiting, fever, or bloody diarrhea Vision loss Side effects that usually do not require medical attention (report to your care team if they continue or are bothersome): Dizziness General discomfort or fatigue This list may not describe all possible side effects. Call your doctor for medical advice about side effects. You may report side effects to FDA at 1-800-FDA-1088. Where should I keep my medication? Keep out of the reach of children and pets. Store at room temperature between 2 and 30 degrees  C (36 and 86 degrees F). Throw away any unused medication after the expiration date. NOTE: This sheet is a summary. It may not cover all possible information. If you have questions about this medicine, talk to your doctor, pharmacist, or health care provider.  2023 Elsevier/Gold Standard (2020-01-27 00:00:00)    If you have been instructed to have an in-person evaluation today at a local Urgent Care facility, please use the link below. It will take you to a list of all of our available  [facility], including address, phone number and hours of operation. Please do not delay care.  [facility]  If you or a family member do not have a primary care provider, use the link below to schedule a visit and establish care. When you choose a [facility] primary care physician or advanced practice provider, you gain a long-term partner in health. Find a Primary Care Provider  Learn more about [facility]'s in-office and virtual care options: [facility]

## 2022-02-15 NOTE — Progress Notes (Signed)
Virtual Visit Consent   Rachael Hahn, you are scheduled for a virtual visit with a [facility] provider today. Just as with appointments in the office, your consent must be obtained to participate. Your consent will be active for this visit and any virtual visit you may have with one of our providers in the next 365 days. If you have a MyChart account, a copy of this consent can be sent to you electronically.  As this is a virtual visit, video technology does not allow for your provider to perform a traditional examination. This may limit your provider's ability to fully assess your condition. If your provider identifies any concerns that need to be evaluated in person or the need to arrange testing (such as labs, EKG, etc.), we will make arrangements to do so. Although advances in technology are sophisticated, we cannot ensure that it will always work on either your end or our end. If the connection with a video visit is poor, the visit may have to be switched to a telephone visit. With either a video or telephone visit, we are not always able to ensure that we have a secure connection.  By engaging in this virtual visit, you consent to the provision of healthcare and authorize for your insurance to be billed (if applicable) for the services provided during this visit. Depending on your insurance coverage, you may receive a charge related to this service.  I need to obtain your verbal consent now. Are you willing to proceed with your visit today? Rachael Hahn has provided verbal consent on 02/15/2022 for a virtual visit (video or telephone). Rachael Loveless, PA-C  Date: 02/15/2022 5:29 PM  Virtual Visit via Video Note   I, Rachael Hahn, connected with  Rachael Hahn  ([id number], [birth year]) on 02/15/22 at  5:30 PM EST by a video-enabled telemedicine application and verified that I am speaking with the correct person using two identifiers.  Location: Patient: Virtual Visit  Location Patient: Home Provider: Virtual Visit Location Provider: Home Office   I discussed the limitations of evaluation and management by telemedicine and the availability of in person appointments. The patient expressed understanding and agreed to proceed.    History of Present Illness: Rachael Hahn is a 27 y.o. who identifies as a female who was assigned female at birth, and is being seen today for headaches.  HPI: Headache  This is a recurrent problem. The current episode started yesterday. The problem occurs intermittently. The problem has been gradually improving. The pain is located in the Retro-orbital and right unilateral region. The pain radiates to the face. The pain quality is similar to prior headaches. The quality of the pain is described as pulsating, sharp and throbbing. The pain is severe. Associated symptoms include blurred vision, eye pain, eye watering, phonophobia, photophobia and a visual change (white spots). Pertinent negatives include no back pain, dizziness, ear pain, eye redness, fever, nausea, sinus pressure or sore throat. The symptoms are aggravated by weather changes. She has tried acetaminophen, Excedrin and darkened room for the symptoms. The treatment provided no relief.      Problems:  Patient Active Problem List   Diagnosis Date Noted   Headache syndrome 03/19/2021   Elevated blood pressure reading without diagnosis of hypertension 03/19/2021    Allergies:  Allergies  Allergen Reactions   Amoxicillin Other (See Comments)    unknown   Medications:  Current Outpatient Medications:    SUMAtriptan (IMITREX) 50 MG tablet, Take 1 tablet (50 mg total)  by mouth every 2 (two) hours as needed for migraine. May repeat in 2 hours if headache persists or recurs., Disp: 10 tablet, Rfl: 0   amLODipine (NORVASC) 5 MG tablet, Take 1 tablet (5 mg total) by mouth daily., Disp: 30 tablet, Rfl: 2   cetirizine (ZYRTEC ALLERGY) 10 MG tablet, Take 1 tablet (10 mg total)  by mouth at bedtime., Disp: 90 tablet, Rfl: 1   fluticasone (FLONASE) 50 MCG/ACT nasal spray, Place 1 spray into both nostrils daily., Disp: 47.4 mL, Rfl: 1   hydrochlorothiazide (HYDRODIURIL) 25 MG tablet, Take 1 tablet (25 mg total) by mouth in the morning., Disp: 30 tablet, Rfl: 2   ibuprofen (ADVIL) 800 MG tablet, Take 1 tablet (800 mg total) by mouth every 8 (eight) hours as needed (pain)., Disp: 21 tablet, Rfl: 0   topiramate (TOPAMAX) 25 MG tablet, Take 1 tablet (25 mg total) by mouth 2 (two) times daily. For headache prevention, Disp: 60 tablet, Rfl: 2   Vitamin D, Ergocalciferol, (DRISDOL) 1.25 MG (50000 UNIT) CAPS capsule, Take 1 capsule (50,000 Units total) by mouth every 7 (seven) days., Disp: 16 capsule, Rfl: 0  Observations/Objective: Patient is well-developed, well-nourished in no acute distress.  Resting comfortably at home.  Head is normocephalic, atraumatic.  No labored breathing.  Speech is clear and coherent with logical content.  Patient is alert and oriented at baseline.    Assessment and Plan: 1. Other migraine without status migrainosus, not intractable - SUMAtriptan (IMITREX) 50 MG tablet; Take 1 tablet (50 mg total) by mouth every 2 (two) hours as needed for migraine. May repeat in 2 hours if headache persists or recurs.  Dispense: 10 tablet; Refill: 0  - History of migraines, but had no abortive treatment - Has tried Topiramate for prevention in the past, never used anything other than Excedrin for abortive therapy - Suspect weather changes triggered this migraine - Imitrex prescribed for future abortive therapy - Push fluids - Rest - Migraine buddy App to track migraines, symptoms, and possible triggers - Follow up in person if worsening or fails to improve  Follow Up Instructions: I discussed the assessment and treatment plan with the patient. The patient was provided an opportunity to ask questions and all were answered. The patient agreed with the plan and  demonstrated an understanding of the instructions.  A copy of instructions were sent to the patient via MyChart unless otherwise noted below.    The patient was advised to call back or seek an in-person evaluation if the symptoms worsen or if the condition fails to improve as anticipated.  Time:  I spent 11 minutes with the patient via telehealth technology discussing the above problems/concerns.    Mar Daring, PA-C

## 2022-03-01 ENCOUNTER — Emergency Department (HOSPITAL_COMMUNITY)
Admission: EM | Admit: 2022-03-01 | Discharge: 2022-03-01 | Disposition: A | Discharge status: Home / Self Care | Payer: BC Managed Care – PPO | Attending: Emergency Medicine | Admitting: Emergency Medicine

## 2022-03-01 ENCOUNTER — Other Ambulatory Visit: Payer: Self-pay

## 2022-03-01 DIAGNOSIS — Z79899 Other long term (current) drug therapy: Secondary | ICD-10-CM | POA: Insufficient documentation

## 2022-03-01 DIAGNOSIS — Z1152 Encounter for screening for COVID-19: Secondary | ICD-10-CM | POA: Diagnosis not present

## 2022-03-01 DIAGNOSIS — R42 Dizziness and giddiness: Secondary | ICD-10-CM | POA: Diagnosis not present

## 2022-03-01 DIAGNOSIS — G43909 Migraine, unspecified, not intractable, without status migrainosus: Secondary | ICD-10-CM | POA: Diagnosis not present

## 2022-03-01 DIAGNOSIS — I10 Essential (primary) hypertension: Secondary | ICD-10-CM | POA: Insufficient documentation

## 2022-03-01 LAB — COMPREHENSIVE METABOLIC PANEL
ALT: 22 U/L (ref 0–44)
AST: 23 U/L (ref 15–41)
Albumin: 3.6 g/dL (ref 3.5–5.0)
Alkaline Phosphatase: 71 U/L (ref 38–126)
Anion gap: 10 (ref 5–15)
BUN: 14 mg/dL (ref 6–20)
CO2: 15 mmol/L — ABNORMAL LOW (ref 22–32)
Calcium: 8.7 mg/dL — ABNORMAL LOW (ref 8.9–10.3)
Chloride: 111 mmol/L (ref 98–111)
Creatinine, Ser: 0.66 mg/dL (ref 0.44–1.00)
GFR, Estimated: >60 mL/min (ref >60)
Glucose, Bld: 86 mg/dL (ref 70–99)
Potassium: 4 mmol/L (ref 3.5–5.1)
Sodium: 136 mmol/L (ref 135–145)
Total Bilirubin: 0.2 mg/dL — ABNORMAL LOW (ref 0.3–1.2)
Total Protein: 6.9 g/dL (ref 6.5–8.1)

## 2022-03-01 LAB — RESP PANEL BY RT-PCR (RSV, FLU A&B, COVID)  RVPGX2
Influenza A by PCR: NEGATIVE
Influenza B by PCR: NEGATIVE
Resp Syncytial Virus by PCR: NEGATIVE
SARS Coronavirus 2 by RT PCR: NEGATIVE

## 2022-03-01 MED ORDER — ACETAMINOPHEN 325 MG PO TABS
650.0000 mg | ORAL_TABLET | Freq: Once | ORAL | Status: AC
  Administered 2022-03-01: 650 mg via ORAL
  Filled 2022-03-01: qty 2

## 2022-03-01 MED ORDER — KETOROLAC TROMETHAMINE 15 MG/ML IJ SOLN
15.0000 mg | Freq: Once | INTRAMUSCULAR | Status: AC
  Administered 2022-03-01: 15 mg via INTRAMUSCULAR
  Filled 2022-03-01: qty 1

## 2022-03-01 NOTE — ED Provider Triage Note (Signed)
Emergency Medicine Provider Triage Evaluation Note  Karl Knarr , a 27 y.o. female  was evaluated in triage.  Pt complains of pain, draining left temporal area and occasionally going to the left eye, states she has some white spots in her vision occasionally.  She states that this happened in the past with migraines as well  Review of Systems  Positive: Headache, photophobia Negative: Vomiting, fever  Physical Exam  BP (!) 146/86 (BP Location: Right Arm)   Pulse 63   Temp 98.1 F (36.7 C) (Oral)   Resp 16   LMP 02/23/2022 (Approximate)   SpO2 99%  Gen:   Awake, no distress   Resp:  Normal effort  MSK:   Moves extremities without difficulty  Other:    Medical Decision Making  Medically screening exam initiated at 8:16 PM.  Appropriate orders placed.  Wreatha Sturgeon was informed that the remainder of the evaluation will be completed by another provider, this initial triage assessment does not replace that evaluation, and the importance of remaining in the ED until their evaluation is complete.     Gwenevere Abbot, [place] 03/01/22 2021

## 2022-03-01 NOTE — ED Triage Notes (Signed)
Pt arrives with reports of migraine headache today x2 hours. Pt reports hx of migraines with seeing white spots and intermittent dizziness. Pt reports pain behind eye.

## 2022-03-01 NOTE — ED Notes (Signed)
Pt visual acuity 20/20 in left eye, 20/20 in right eye.

## 2022-03-01 NOTE — ED Provider Notes (Signed)
[facility] EMERGENCY DEPARTMENT AT [facility] Provider Note   CSN: [id number] Arrival date & time: 03/01/22  1927     History  Chief Complaint  Patient presents with   Migraine    Rachael Hahn is a 27 y.o. female.  For that she is having migraine started today, feels like previous migraines, intermittently seeing white spots in left eye though this is resolved at this time.  States she has had migraines previously that have the same visual changes.  She denies blurry vision or loss of vision.  Denies neck stiffness fevers or chills.  She did not take any medications at home for symptoms.  She denies chest pain or shortness of breath.  Have history of hypertension and takes amlodipine and hydrochlorothiazide for this   Migraine       Home Medications Prior to Admission medications   Medication Sig Start Date End Date Taking? Authorizing Provider  amLODipine (NORVASC) 5 MG tablet Take 1 tablet (5 mg total) by mouth daily. 12/20/21 03/20/22  Lynden Oxford Scales, PA-C  cetirizine (ZYRTEC ALLERGY) 10 MG tablet Take 1 tablet (10 mg total) by mouth at bedtime. 12/20/21 06/18/22  Lynden Oxford Scales, PA-C  fluticasone (FLONASE) 50 MCG/ACT nasal spray Place 1 spray into both nostrils daily. 12/20/21   Lynden Oxford Scales, PA-C  hydrochlorothiazide (HYDRODIURIL) 25 MG tablet Take 1 tablet (25 mg total) by mouth in the morning. 12/20/21 03/20/22  Lynden Oxford Scales, PA-C  ibuprofen (ADVIL) 800 MG tablet Take 1 tablet (800 mg total) by mouth every 8 (eight) hours as needed (pain). 10/14/21   Barrett Henle, MD  SUMAtriptan (IMITREX) 50 MG tablet Take 1 tablet (50 mg total) by mouth every 2 (two) hours as needed for migraine. May repeat in 2 hours if headache persists or recurs. 02/15/22   Mar Daring, PA-C  topiramate (TOPAMAX) 25 MG tablet Take 1 tablet (25 mg total) by mouth 2 (two) times daily. For headache prevention 06/03/21   Argentina Donovan, PA-C   Vitamin D, Ergocalciferol, (DRISDOL) 1.25 MG (50000 UNIT) CAPS capsule Take 1 capsule (50,000 Units total) by mouth every 7 (seven) days. 06/03/21   Argentina Donovan, PA-C      Allergies    Amoxicillin    Review of Systems   Review of Systems  Physical Exam Updated Vital Signs BP (!) 146/86 (BP Location: Right Arm)   Pulse 63   Temp 98.1 F (36.7 C) (Oral)   Resp 16   LMP 02/23/2022 (Approximate)   SpO2 99%  Physical Exam Vitals and nursing note reviewed.  Constitutional:      General: She is not in acute distress.    Appearance: She is well-developed.  HENT:     Head: Normocephalic and atraumatic.     Right Ear: Tympanic membrane normal.     Left Ear: Tympanic membrane normal.     Mouth/Throat:     Mouth: Mucous membranes are moist.  Eyes:     Conjunctiva/sclera: Conjunctivae normal.  Cardiovascular:     Rate and Rhythm: Normal rate and regular rhythm.     Heart sounds: No murmur heard. Pulmonary:     Effort: Pulmonary effort is normal. No respiratory distress.     Breath sounds: Normal breath sounds.  Abdominal:     Palpations: Abdomen is soft.     Tenderness: There is no abdominal tenderness.  Musculoskeletal:        General: No swelling.     Cervical back: Neck  supple.  Skin:    General: Skin is warm and dry.     Capillary Refill: Capillary refill takes less than 2 seconds.  Neurological:     General: No focal deficit present.     Mental Status: She is alert and oriented to person, place, and time.     Cranial Nerves: No cranial nerve deficit.     Sensory: No sensory deficit.     Motor: No weakness.     Coordination: Coordination normal.     Gait: Gait normal.  Psychiatric:        Mood and Affect: Mood normal.        Behavior: Behavior normal.     ED Results / Procedures / Treatments   Labs (all labs ordered are listed, but only abnormal results are displayed) Labs Reviewed  COMPREHENSIVE METABOLIC PANEL - Abnormal; Notable for the following  components:      Result Value   CO2 15 (*)    Calcium 8.7 (*)    Total Bilirubin 0.2 (*)    All other components within normal limits  RESP PANEL BY RT-PCR (RSV, FLU A&B, COVID)  RVPGX2  CBC WITH DIFFERENTIAL/PLATELET    EKG None  Radiology No results found.  Procedures Procedures    Medications Ordered in ED Medications  acetaminophen (TYLENOL) tablet 650 mg (650 mg Oral Given 03/01/22 2011)  ketorolac (TORADOL) 15 MG/ML injection 15 mg (15 mg Intramuscular Given 03/01/22 2259)    ED Course/ Medical Decision Making/ A&P                             Medical Decision Making Differential diagnosis: Migraine, intracranial hemorrhage, tension headache, sinusitis, temporal arteritis, other  ED course: Patient comes in for complaints of headache for the past couple of hours that is gradual in onset she has a normal neurologic exam and states she has had headaches like this in the past.  Her exam is very reassuring her vitals are reassuring as well.  Denies chance of pregnancy, last menstrual period was 6 days ago.   Your labs and interpreted them as following: Swab was negative for COVID flu and RSV.  CMP was drawn and does show low CO2.  Refused any further sticks for any other labs they were unable to obtain CBC.  She is well-appearing, does not want repeat labs.  The low CO2 may have been due to hyperventilation as she was apparently very nervous due to blood draw.  She is not in DKA, clinically she is well-appearing.  Do not feel is necessary to repeat labs.  I considered imaging with CT of the head but patient has history of migraines that are the same.  Plan to treat her symptoms.  She has a normal neurologic exam.  Do not feel the risk of radiation's are outweighed by any potential benefits at this time.  Regarding her spots in her vision she states she has had this with migraine in the past may be related to an aura.  Will obtain a visual acuity the patient states she is not  having these spots at this time.    Vision is 20/20 bilaterally  Patient was given Tylenol and ketorolac for headache, repeat evaluation shows that she is feeling better, she is asking to go home and asking for work note for her job        Final Clinical Impression(s) / ED Diagnoses Final diagnoses:  Migraine without status  migrainosus, not intractable, unspecified migraine type    Rx / DC Orders ED Discharge Orders     None         Josem Kaufmann 03/01/22 2329    Linwood Dibbles, MD 03/04/22 1701

## 2022-03-01 NOTE — Discharge Instructions (Signed)
You were seen today for migraine headache.  Since you are feeling better after the medicines and feel ready to go home we will discharge you.  Follow-up with your primary care doctor and come back to the ER if you have any new or worsening symptoms.  Specifically if you have new or persistent changes in your vision you should come back and/or see an eye doctor

## 2022-03-14 ENCOUNTER — Ambulatory Visit
Admission: RE | Admit: 2022-03-14 | Discharge: 2022-03-14 | Disposition: A | Discharge status: Home / Self Care | Payer: BC Managed Care – PPO | Source: Ambulatory Visit | Attending: Nurse Practitioner | Admitting: Nurse Practitioner

## 2022-03-14 VITALS — BP 133/87 | HR 59 | Temp 98.1°F | Resp 16

## 2022-03-14 DIAGNOSIS — G43809 Other migraine, not intractable, without status migrainosus: Secondary | ICD-10-CM | POA: Diagnosis not present

## 2022-03-14 MED ORDER — NARATRIPTAN HCL 2.5 MG PO TABS: 2.5000 mg | ORAL_TABLET | ORAL | 0 refills | Status: DC | PRN

## 2022-03-14 NOTE — Discharge Instructions (Signed)
Naratriptan as needed for migraine Keep headache diary and take your PCP for further evaluation of your migraine headaches Remain hydrated Please go to the emergency room if you develop any worsening symptoms

## 2022-03-14 NOTE — ED Triage Notes (Signed)
Pt c/o migraine that has not been resolved.   Home intervention: Imitrex

## 2022-03-14 NOTE — ED Provider Notes (Signed)
[facility]    CSN: [id number] Arrival date & time: 03/14/22  1037      History   Chief Complaint Chief Complaint  Patient presents with   Migraine    Entered by patient    HPI Rachael Hahn is a 27 y.o. female presents for evaluation of headache.  Patient reports a history of migraine headaches over the past 6 years.  She reports recently she seems to be getting more headaches than normal.  Last night when she walked into work she developed a migraine causing her to have to leave.  She endorses it is a unilateral headache that is associated with aura/visual changes.  Denies any syncope, dizziness, nausea/vomiting.  She had been prescribed sumatriptan and di take a total of 3 doses yesterday without improvement in her symptoms.  She does not feel this medication works for her.  She has previously been on Topamax.  She does not currently have a PCP.  She states her migraine from last night has completely resolved and she is currently headache/migraine free.  She is interested in a different treatment option.  No other concerns at this time.  Migraine Associated symptoms include headaches.    Past Medical History:  Diagnosis Date   Arthritis    Depression    Hypertension    Trichomoniasis     Patient Active Problem List   Diagnosis Date Noted   Headache syndrome 03/19/2021   Elevated blood pressure reading without diagnosis of hypertension 03/19/2021    History reviewed. No pertinent surgical history.  OB History   No obstetric history on file.      Home Medications    Prior to Admission medications   Medication Sig Start Date End Date Taking? Authorizing Provider  naratriptan (AMERGE) 2.5 MG tablet Take 1 tablet (2.5 mg total) by mouth as needed for migraine. Take one (1) tablet at onset of headache; if returns or does not resolve, may repeat after 4 hours; do not exceed five (5) mg in 24 hours. 03/14/22  Yes Melynda Ripple, NP  amLODipine (NORVASC) 5 MG  tablet Take 1 tablet (5 mg total) by mouth daily. 12/20/21 03/20/22  Lynden Oxford Scales, PA-C  cetirizine (ZYRTEC ALLERGY) 10 MG tablet Take 1 tablet (10 mg total) by mouth at bedtime. 12/20/21 06/18/22  Lynden Oxford Scales, PA-C  fluticasone (FLONASE) 50 MCG/ACT nasal spray Place 1 spray into both nostrils daily. 12/20/21   Lynden Oxford Scales, PA-C  hydrochlorothiazide (HYDRODIURIL) 25 MG tablet Take 1 tablet (25 mg total) by mouth in the morning. 12/20/21 03/20/22  Lynden Oxford Scales, PA-C  ibuprofen (ADVIL) 800 MG tablet Take 1 tablet (800 mg total) by mouth every 8 (eight) hours as needed (pain). 10/14/21   Barrett Henle, MD  Vitamin D, Ergocalciferol, (DRISDOL) 1.25 MG (50000 UNIT) CAPS capsule Take 1 capsule (50,000 Units total) by mouth every 7 (seven) days. 06/03/21   Argentina Donovan, PA-C    Family History Family History  Problem Relation Age of Onset   Healthy Mother    Healthy Father     Social History Social History   Tobacco Use   Smoking status: Never   Smokeless tobacco: Never  Vaping Use   Vaping Use: Never used  Substance Use Topics   Alcohol use: Not Currently   Drug use: Not Currently     Allergies   Amoxicillin   Review of Systems Review of Systems  Neurological:  Positive for headaches.     Physical  Exam Triage Vital Signs ED Triage Vitals  Enc Vitals Group     BP 03/14/22 1108 133/87     Pulse Rate 03/14/22 1108 (!) 59     Resp 03/14/22 1108 16     Temp 03/14/22 1108 98.1 F (36.7 C)     Temp Source 03/14/22 1108 Oral     SpO2 03/14/22 1108 98 %     Weight --      Height --      Head Circumference --      Peak Flow --      Pain Score 03/14/22 1105 0     Pain Loc --      Pain Edu? --      Excl. in [place]? --    No data found.  Updated Vital Signs BP 133/87 (BP Location: Right Arm)   Pulse (!) 59   Temp 98.1 F (36.7 C) (Oral)   Resp 16   LMP 02/23/2022 (Approximate)   SpO2 98%   Visual Acuity Right Eye Distance:    Left Eye Distance:   Bilateral Distance:    Right Eye Near:   Left Eye Near:    Bilateral Near:     Physical Exam Vitals and nursing note reviewed.  Constitutional:      General: She is not in acute distress.    Appearance: She is well-developed. She is not ill-appearing.  HENT:     Head: Normocephalic and atraumatic.     Nose: No congestion.     Mouth/Throat:     Pharynx: Uvula midline. No posterior oropharyngeal erythema.     Tonsils: No tonsillar exudate or tonsillar abscesses.  Eyes:     Conjunctiva/sclera: Conjunctivae normal.     Pupils: Pupils are equal, round, and reactive to light.  Cardiovascular:     Rate and Rhythm: Normal rate and regular rhythm.     Heart sounds: Normal heart sounds.  Pulmonary:     Effort: Pulmonary effort is normal.     Breath sounds: Normal breath sounds.  Musculoskeletal:     Cervical back: Normal range of motion and neck supple.  Lymphadenopathy:     Cervical: No cervical adenopathy.  Skin:    General: Skin is warm and dry.  Neurological:     General: No focal deficit present.     Mental Status: She is alert and oriented to person, place, and time.  Psychiatric:        Mood and Affect: Mood normal.        Behavior: Behavior normal.      UC Treatments / Results  Labs (all labs ordered are listed, but only abnormal results are displayed) Labs Reviewed - No data to display  EKG   Radiology No results found.  Procedures Procedures (including critical care time)  Medications Ordered in UC Medications - No data to display  Initial Impression / Assessment and Plan / UC Course  I have reviewed the triage vital signs and the nursing notes.  Pertinent labs & imaging results that were available during my care of the patient were reviewed by me and considered in my medical decision making (see chart for details).     Reviewed exam and symptoms with patient.  No red flags on exam. Trial of Naratriptan.  Side effect file  reviewed patient to stop sumatriptan Advised to keep a headache diary and to take to her PCP Remain hydrated Strict ER precautions reviewed and patient verbalized understanding Final Clinical Impressions(s) / UC Diagnoses  Final diagnoses:  Other migraine without status migrainosus, not intractable     Discharge Instructions      Naratriptan as needed for migraine Keep headache diary and take your PCP for further evaluation of your migraine headaches Remain hydrated Please go to the emergency room if you develop any worsening symptoms   ED Prescriptions     Medication Sig Dispense Auth. Provider   naratriptan (AMERGE) 2.5 MG tablet Take 1 tablet (2.5 mg total) by mouth as needed for migraine. Take one (1) tablet at onset of headache; if returns or does not resolve, may repeat after 4 hours; do not exceed five (5) mg in 24 hours. 10 tablet Melynda Ripple, NP      PDMP not reviewed this encounter.   Melynda Ripple, NP 03/14/22 1124

## 2022-04-05 ENCOUNTER — Ambulatory Visit: Payer: BC Managed Care – PPO | Admitting: Nurse Practitioner

## 2022-04-11 ENCOUNTER — Ambulatory Visit: Payer: BC Managed Care – PPO | Admitting: Nurse Practitioner

## 2022-04-12 ENCOUNTER — Telehealth: Payer: Self-pay | Admitting: Nurse Practitioner

## 2022-04-12 ENCOUNTER — Ambulatory Visit: Payer: BC Managed Care – PPO | Admitting: Nurse Practitioner

## 2022-04-12 NOTE — Telephone Encounter (Signed)
3.5.24 no show letter sent

## 2022-04-18 ENCOUNTER — Ambulatory Visit: Payer: BC Managed Care – PPO | Attending: Internal Medicine | Admitting: Internal Medicine

## 2022-04-19 NOTE — Telephone Encounter (Signed)
No show for new patient visit - 2 previous cancellations - unable to reschedule at [facility]

## 2022-04-28 ENCOUNTER — Ambulatory Visit: Payer: BC Managed Care – PPO

## 2022-05-23 ENCOUNTER — Ambulatory Visit: Payer: BC Managed Care – PPO

## 2022-05-23 ENCOUNTER — Ambulatory Visit
Admission: EM | Admit: 2022-05-23 | Discharge: 2022-05-23 | Disposition: A | Discharge status: Home / Self Care | Payer: BC Managed Care – PPO | Attending: Nurse Practitioner | Admitting: Nurse Practitioner

## 2022-05-23 DIAGNOSIS — Z760 Encounter for issue of repeat prescription: Secondary | ICD-10-CM

## 2022-05-23 DIAGNOSIS — I10 Essential (primary) hypertension: Secondary | ICD-10-CM | POA: Diagnosis not present

## 2022-05-23 MED ORDER — AMLODIPINE BESYLATE 5 MG PO TABS: 5.0000 mg | ORAL_TABLET | Freq: Every day | ORAL | 0 refills | Status: DC

## 2022-05-23 MED ORDER — HYDROCHLOROTHIAZIDE 25 MG PO TABS: 25.0000 mg | ORAL_TABLET | Freq: Every morning | ORAL | 0 refills | Status: DC

## 2022-05-23 NOTE — Discharge Instructions (Signed)
Your amlodipine and hydrochlorothiazide have been sent to your pharmacy.  Please establish with a primary care provider for management of your high blood pressure as well as for any additional medication refills

## 2022-05-23 NOTE — ED Provider Notes (Addendum)
[facility]    CSN: [id number] Arrival date & time: 05/23/22  9562      History   Chief Complaint Chief Complaint  Patient presents with   Medication Refill    HPI Rachael Hahn is a 27 y.o. female presents for medication refills.  Patient states that she was started on 5 mg amlodipine and 25 mg hydrochlorothiazide at urgent care several months ago.  She states she has been taking as prescribed but needs refills.  She ran out of her hydrochlorothiazide 2 days ago but still has a couple days of amlodipine.  She has not yet established with a primary care doctor.  Denies any headaches, chest pain, shortness of breath, dizziness, palpitations.  No other concerns at this time.   Medication Refill   Past Medical History:  Diagnosis Date   Arthritis    Depression    Hypertension    Trichomoniasis     Patient Active Problem List   Diagnosis Date Noted   Headache syndrome 03/19/2021   Elevated blood pressure reading without diagnosis of hypertension 03/19/2021    History reviewed. No pertinent surgical history.  OB History   No obstetric history on file.      Home Medications    Prior to Admission medications   Medication Sig Start Date End Date Taking? Authorizing Provider  amLODipine (NORVASC) 5 MG tablet Take 1 tablet (5 mg total) by mouth daily. 05/23/22 08/21/22  Radford Pax, NP  cetirizine (ZYRTEC ALLERGY) 10 MG tablet Take 1 tablet (10 mg total) by mouth at bedtime. 12/20/21 06/18/22  Theadora Rama Scales, PA-C  fluticasone (FLONASE) 50 MCG/ACT nasal spray Place 1 spray into both nostrils daily. 12/20/21   Theadora Rama Scales, PA-C  hydrochlorothiazide (HYDRODIURIL) 25 MG tablet Take 1 tablet (25 mg total) by mouth in the morning. 05/23/22 08/21/22  Radford Pax, NP  ibuprofen (ADVIL) 800 MG tablet Take 1 tablet (800 mg total) by mouth every 8 (eight) hours as needed (pain). 10/14/21   Zenia Resides, MD  naratriptan (AMERGE) 2.5 MG tablet Take  1 tablet (2.5 mg total) by mouth as needed for migraine. Take one (1) tablet at onset of headache; if returns or does not resolve, may repeat after 4 hours; do not exceed five (5) mg in 24 hours. 03/14/22   Radford Pax, NP  Vitamin D, Ergocalciferol, (DRISDOL) 1.25 MG (50000 UNIT) CAPS capsule Take 1 capsule (50,000 Units total) by mouth every 7 (seven) days. 06/03/21   Anders Simmonds, PA-C    Family History Family History  Problem Relation Age of Onset   Healthy Mother    Healthy Father     Social History Social History   Tobacco Use   Smoking status: Never   Smokeless tobacco: Never  Vaping Use   Vaping Use: Never used  Substance Use Topics   Alcohol use: Not Currently   Drug use: Not Currently     Allergies   Amoxicillin   Review of Systems Review of Systems  Constitutional:        Medication refill     Physical Exam Triage Vital Signs ED Triage Vitals [05/23/22 1024]  Enc Vitals Group     BP (!) 147/83     Pulse Rate (!) 59     Resp 17     Temp 98.3 F (36.8 C)     Temp Source Oral     SpO2 98 %     Weight  Height      Head Circumference      Peak Flow      Pain Score      Pain Loc      Pain Edu?      Excl. in [place]?    No data found.  Updated Vital Signs BP (!) 147/83 (BP Location: Left Arm)   Pulse (!) 59   Temp 98.3 F (36.8 C) (Oral)   Resp 17   LMP 05/14/2022   SpO2 98%   Visual Acuity Right Eye Distance:   Left Eye Distance:   Bilateral Distance:    Right Eye Near:   Left Eye Near:    Bilateral Near:     Physical Exam Vitals and nursing note reviewed.  Constitutional:      Appearance: Normal appearance.  HENT:     Head: Normocephalic and atraumatic.  Eyes:     Pupils: Pupils are equal, round, and reactive to light.  Cardiovascular:     Rate and Rhythm: Normal rate.  Pulmonary:     Effort: Pulmonary effort is normal.  Neurological:     General: No focal deficit present.     Mental Status: She is alert and oriented  to person, place, and time.  Psychiatric:        Mood and Affect: Mood normal.        Behavior: Behavior normal.      UC Treatments / Results  Labs (all labs ordered are listed, but only abnormal results are displayed) Labs Reviewed - No data to display  EKG   Radiology No results found.  Procedures Procedures (including critical care time)  Medications Ordered in UC Medications - No data to display  Initial Impression / Assessment and Plan / UC Course  I have reviewed the triage vital signs and the nursing notes.  Pertinent labs & imaging results that were available during my care of the patient were reviewed by me and considered in my medical decision making (see chart for details).     Refilled amlodipine and hydrochlorothiazide.  Advised patient additional refills need to go through primary care and that she needs to establish with one for further refills as well as health maintenance and patient verbalized understanding Final Clinical Impressions(s) / UC Diagnoses   Final diagnoses:  Encounter for medication refill  Essential hypertension     Discharge Instructions      Your amlodipine and hydrochlorothiazide have been sent to your pharmacy.  Please establish with a primary care provider for management of your high blood pressure as well as for any additional medication refills   ED Prescriptions     Medication Sig Dispense Auth. Provider   amLODipine (NORVASC) 5 MG tablet Take 1 tablet (5 mg total) by mouth daily. 30 tablet Radford Pax, NP   hydrochlorothiazide (HYDRODIURIL) 25 MG tablet Take 1 tablet (25 mg total) by mouth in the morning. 30 tablet Radford Pax, NP      PDMP not reviewed this encounter.   Radford Pax, NP 05/23/22 1031    Radford Pax, NP 05/23/22 1032

## 2022-05-23 NOTE — ED Triage Notes (Signed)
Pt requesting refill on her amlodipine and hydrochlorothiazide. States has been out for 2 days. Denies any sx's.

## 2022-05-27 DIAGNOSIS — Z113 Encounter for screening for infections with a predominantly sexual mode of transmission: Secondary | ICD-10-CM | POA: Diagnosis not present

## 2022-05-27 DIAGNOSIS — N898 Other specified noninflammatory disorders of vagina: Secondary | ICD-10-CM | POA: Diagnosis not present

## 2022-05-27 DIAGNOSIS — Z01419 Encounter for gynecological examination (general) (routine) without abnormal findings: Secondary | ICD-10-CM | POA: Diagnosis not present

## 2022-05-27 DIAGNOSIS — Z124 Encounter for screening for malignant neoplasm of cervix: Secondary | ICD-10-CM | POA: Diagnosis not present

## 2022-05-31 IMAGING — CR DG CHEST 2V
2 series · 2 of 2 positions shown · non-contrast
Comparison: 07/23/2019

CLINICAL DATA: Intermittent palpitations since yesterday.

EXAM:
CHEST - 2 VIEW

[w chest pa]
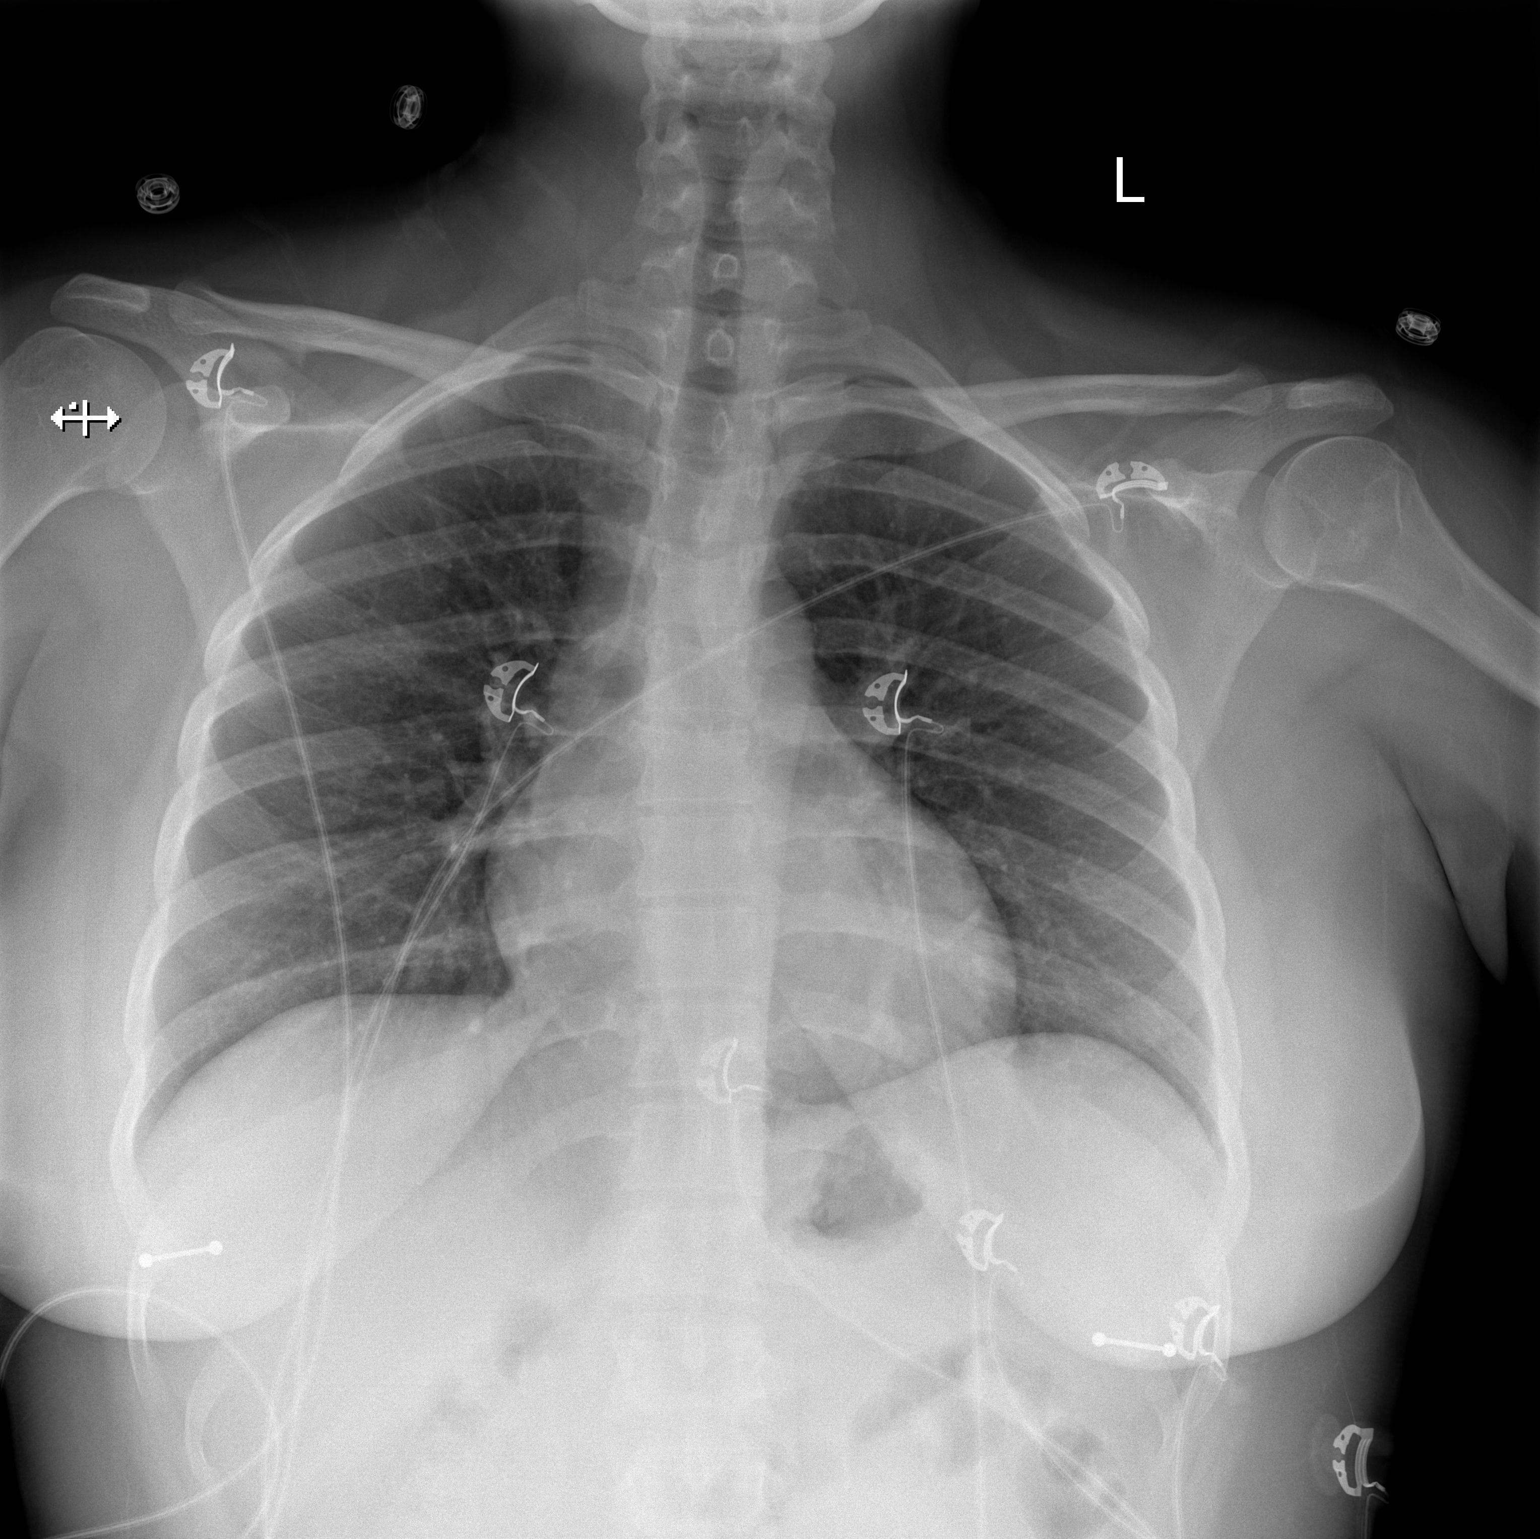

[w chest lat]
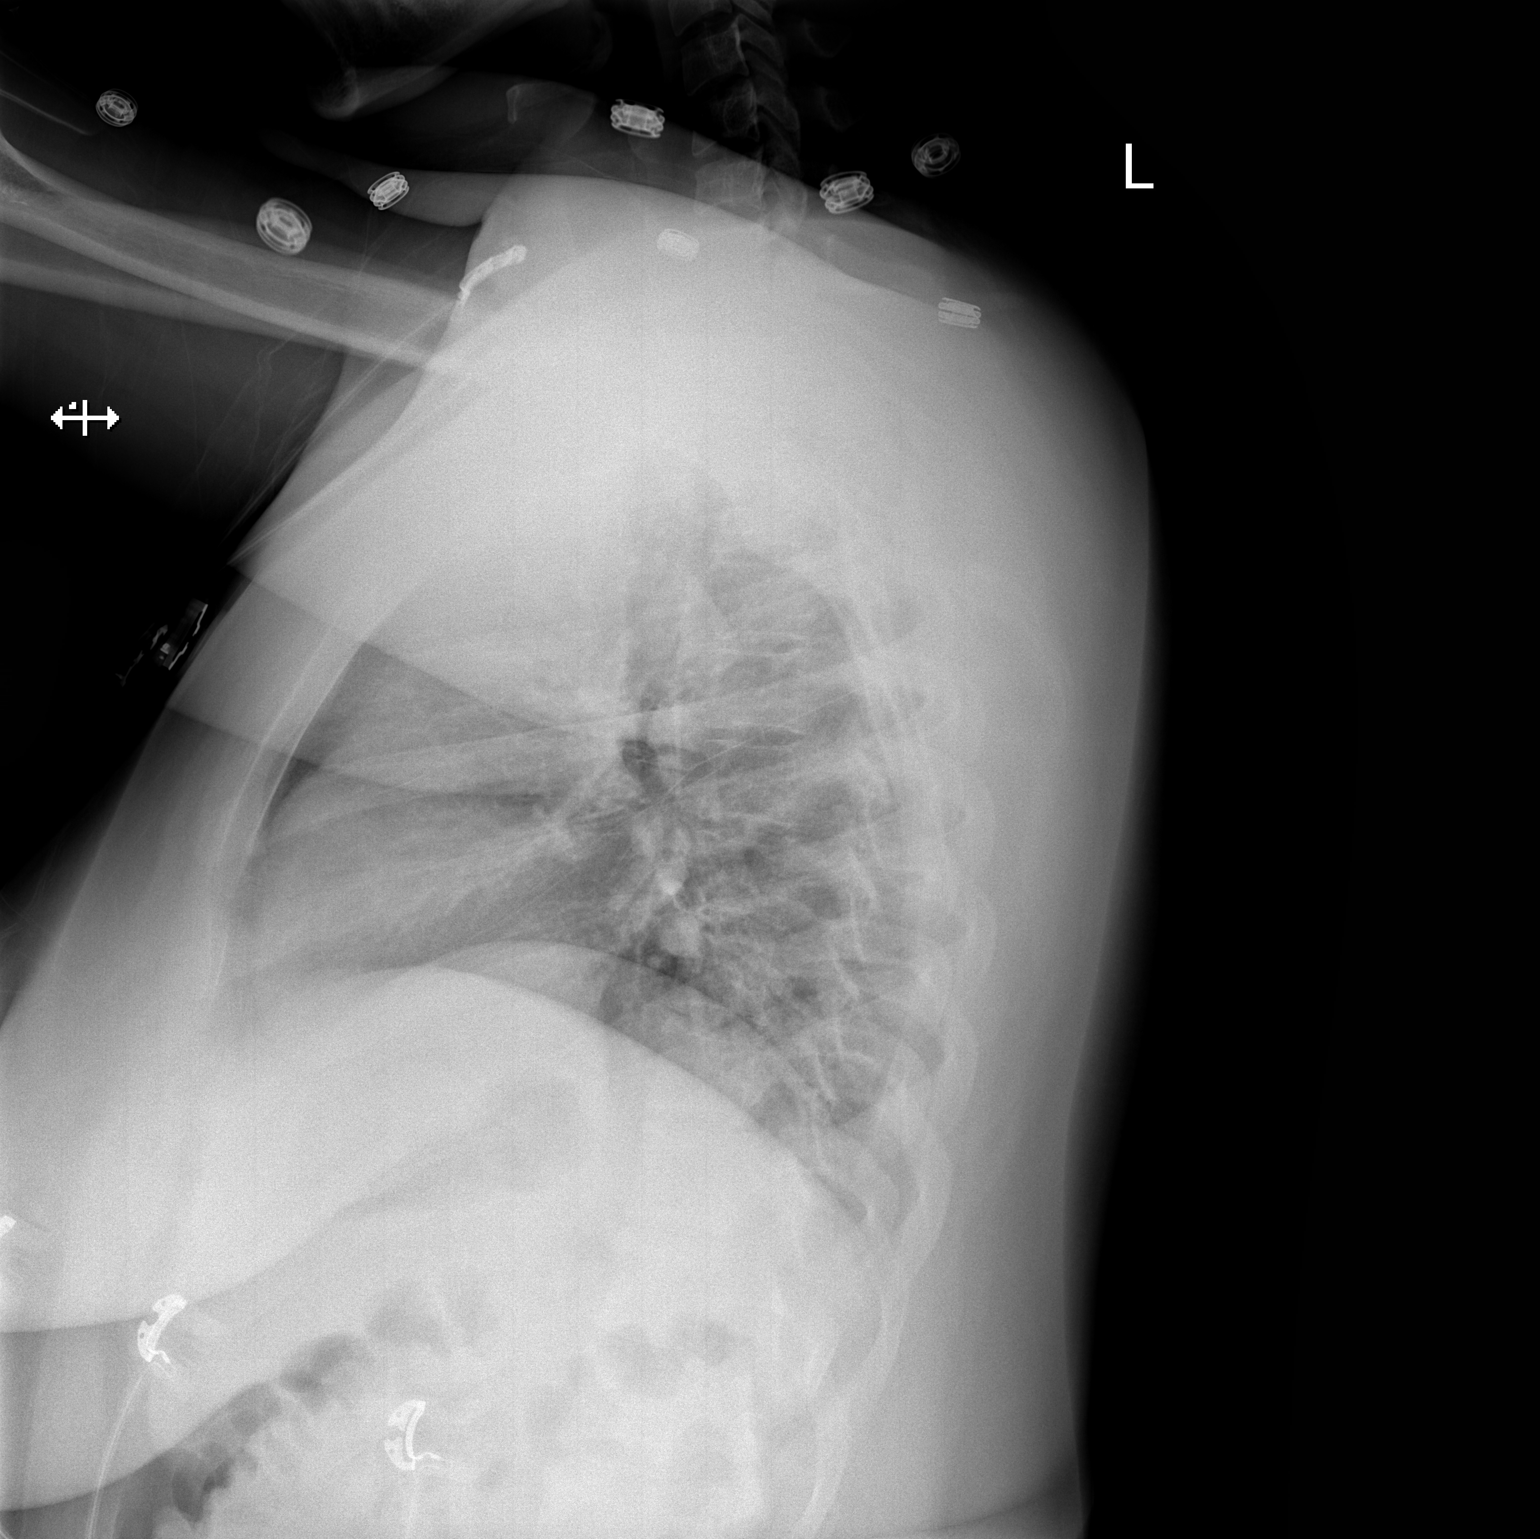

[2 of 2 positions shown; findings below may reference images not displayed]

FINDINGS: The cardiomediastinal silhouette is within normal limits. The lungs
are well inflated and clear. There is no evidence of pleural
effusion or pneumothorax. No acute osseous abnormality is
identified.
IMPRESSION: No active cardiopulmonary disease.

## 2022-06-08 ENCOUNTER — Ambulatory Visit (INDEPENDENT_AMBULATORY_CARE_PROVIDER_SITE_OTHER): Payer: BC Managed Care – PPO | Admitting: Nurse Practitioner

## 2022-06-08 ENCOUNTER — Encounter: Payer: Self-pay | Admitting: Nurse Practitioner

## 2022-06-08 VITALS — BP 132/90 | HR 55 | Temp 97.8°F | Ht 64.0 in | Wt 203.6 lb

## 2022-06-08 DIAGNOSIS — Z23 Encounter for immunization: Secondary | ICD-10-CM | POA: Diagnosis not present

## 2022-06-08 DIAGNOSIS — I10 Essential (primary) hypertension: Secondary | ICD-10-CM | POA: Diagnosis not present

## 2022-06-08 DIAGNOSIS — E559 Vitamin D deficiency, unspecified: Secondary | ICD-10-CM

## 2022-06-08 DIAGNOSIS — G43709 Chronic migraine without aura, not intractable, without status migrainosus: Secondary | ICD-10-CM | POA: Diagnosis not present

## 2022-06-08 DIAGNOSIS — E669 Obesity, unspecified: Secondary | ICD-10-CM | POA: Diagnosis not present

## 2022-06-08 DIAGNOSIS — Z6834 Body mass index (BMI) 34.0-34.9, adult: Secondary | ICD-10-CM | POA: Diagnosis not present

## 2022-06-08 LAB — CBC
HCT: 39.6 % (ref 36.0–46.0)
Hemoglobin: 13.6 g/dL (ref 12.0–15.0)
MCHC: 34.3 g/dL (ref 30.0–36.0)
MCV: 86.8 fl (ref 78.0–100.0)
Platelets: 319 10*3/uL (ref 150.0–400.0)
RBC: 4.56 Mil/uL (ref 3.87–5.11)
RDW: 13.1 % (ref 11.5–15.5)
WBC: 4.3 10*3/uL (ref 4.0–10.5)

## 2022-06-08 LAB — HEMOGLOBIN A1C: Hgb A1c MFr Bld: 5.3 % (ref 4.6–6.5)

## 2022-06-08 NOTE — Addendum Note (Signed)
Addended by: Lake Bells on: 06/08/2022 04:27 PM   Modules accepted: Orders

## 2022-06-08 NOTE — Patient Instructions (Addendum)
It was great to see you!  Keep taking your blood pressure medication.   We are checking your labs today and will let you know the results via mychart/phone.   Decrease the amount of salt you are eating in your diet. Increase exercise slowly.   Start vitamin D supplement 2,000 units daily. You can get this at any pharmacy.   Let's follow-up in 6 months, sooner if you have concerns.  If a referral was placed today, you will be contacted for an appointment. Please note that routine referrals can sometimes take up to 3-4 weeks to process. Please call our office if you haven't heard anything after this time frame.  Take care,  Rodman Pickle, NP

## 2022-06-08 NOTE — Progress Notes (Signed)
New Patient Visit  BP (!) 132/90 (BP Location: Left Arm, Cuff Size: Large)   Pulse (!) 55   Temp 97.8 F (36.6 C)   Ht 5\' 4"  (1.626 m)   Wt 203 lb 9.6 oz (92.4 kg)   LMP 05/14/2022   SpO2 98%   BMI 34.95 kg/m    Subjective:    Patient ID: Rachael Hahn, female    DOB: Oct 24, 1995, 27 y.o.   MRN: [id number]  CC: Chief Complaint  Patient presents with   Establish Care    NP. Est. Care, concerns with headaches and elevated B/P    HPI: Rachael Hahn is a 27 y.o. female presents for new patient visit to establish care.  Introduced to Publishing rights manager role and practice setting.  All questions answered.  Discussed provider/patient relationship and expectations.  She has a history of hypertension and is currently taking amlodipine 5 mg daily and hydrochlorothiazide 25 mg daily.  She did not take her medications today.  She has not been checking her blood pressure at home as she does not know where her blood pressure cuff is.  She is hoping to eventually come off of her medications.  She denies chest pain and shortness of breath.  She states that she has been having some headaches recently, however she does have a history of migraines but it also happens when her blood pressure is elevated. When she does get migraines, she will take sumatriptan 50 mg as needed.  She does endorse sensitivity to light and sound, nausea.  Depression and anxiety screen done:     06/08/2022    9:03 AM 03/19/2021    1:45 PM  Depression screen PHQ 2/9  Decreased Interest 0 0  Down, Depressed, Hopeless 0 0  PHQ - 2 Score 0 0  Altered sleeping 0   Tired, decreased energy 2   Change in appetite 2   Feeling bad or failure about yourself  0   Trouble concentrating 1   Moving slowly or fidgety/restless 0   Suicidal thoughts 0   PHQ-9 Score 5   Difficult doing work/chores Not difficult at all       06/08/2022    9:04 AM 03/19/2021    1:45 PM  GAD 7 : Generalized Anxiety Score  Nervous, Anxious,  on Edge 0 0  Control/stop worrying 0 0  Worry too much - different things 3 0  Trouble relaxing 0 0  Restless 0 0  Easily annoyed or irritable 0 0  Afraid - awful might happen 0 0  Total GAD 7 Score 3 0  Anxiety Difficulty Not difficult at all     Past Medical History:  Diagnosis Date   Arthritis    Depression    Hypertension    Migraine    Trichomoniasis     History reviewed. No pertinent surgical history.  Family History  Problem Relation Age of Onset   Hypertension Mother        off medication   Hypertension Father    Diabetes Maternal Aunt    Hypertension Maternal Aunt      Social History   Tobacco Use   Smoking status: Never   Smokeless tobacco: Never  Vaping Use   Vaping Use: Never used  Substance Use Topics   Alcohol use: Yes    Comment: occasionally   Drug use: Not Currently    Current Outpatient Medications on File Prior to Visit  Medication Sig Dispense Refill   amLODipine (NORVASC) 5 MG tablet  Take 1 tablet (5 mg total) by mouth daily. 30 tablet 0   hydrochlorothiazide (HYDRODIURIL) 25 MG tablet Take 1 tablet (25 mg total) by mouth in the morning. 30 tablet 0   SUMAtriptan (IMITREX) 50 MG tablet Take 50 mg by mouth every 2 (two) hours as needed for migraine. May repeat in 2 hours if headache persists or recurs.     No current facility-administered medications on file prior to visit.     Review of Systems  Constitutional:  Positive for fatigue. Negative for fever.  HENT: Negative.    Eyes: Negative.   Respiratory: Negative.    Cardiovascular:  Positive for leg swelling (at times). Negative for chest pain and palpitations.  Gastrointestinal: Negative.   Genitourinary: Negative.   Musculoskeletal: Negative.   Skin: Negative.   Neurological:  Positive for headaches. Negative for dizziness.  Psychiatric/Behavioral: Negative.        Objective:    BP (!) 132/90 (BP Location: Left Arm, Cuff Size: Large)   Pulse (!) 55   Temp 97.8 F (36.6 C)    Ht 5\' 4"  (1.626 m)   Wt 203 lb 9.6 oz (92.4 kg)   LMP 05/14/2022   SpO2 98%   BMI 34.95 kg/m   Wt Readings from Last 3 Encounters:  06/08/22 203 lb 9.6 oz (92.4 kg)  05/27/21 212 lb 6.4 oz (96.3 kg)  03/19/21 212 lb 3.2 oz (96.3 kg)    BP Readings from Last 3 Encounters:  06/08/22 (!) 132/90  05/23/22 (!) 147/83  03/14/22 133/87    Physical Exam Vitals and nursing note reviewed.  Constitutional:      General: She is not in acute distress.    Appearance: Normal appearance.  HENT:     Head: Normocephalic and atraumatic.     Right Ear: Tympanic membrane, ear canal and external ear normal.     Left Ear: Tympanic membrane, ear canal and external ear normal.  Eyes:     Conjunctiva/sclera: Conjunctivae normal.  Cardiovascular:     Rate and Rhythm: Normal rate and regular rhythm.     Pulses: Normal pulses.     Heart sounds: Normal heart sounds.  Pulmonary:     Effort: Pulmonary effort is normal.     Breath sounds: Normal breath sounds.  Abdominal:     Palpations: Abdomen is soft.     Tenderness: There is no abdominal tenderness.  Musculoskeletal:        General: Normal range of motion.     Cervical back: Normal range of motion and neck supple.     Right lower leg: No edema.     Left lower leg: No edema.  Lymphadenopathy:     Cervical: No cervical adenopathy.  Skin:    General: Skin is warm and dry.  Neurological:     General: No focal deficit present.     Mental Status: She is alert and oriented to person, place, and time.     Cranial Nerves: No cranial nerve deficit.     Coordination: Coordination normal.     Gait: Gait normal.  Psychiatric:        Mood and Affect: Mood normal.        Behavior: Behavior normal.        Thought Content: Thought content normal.        Judgment: Judgment normal.        Assessment & Plan:   Problem List Items Addressed This Visit       Cardiovascular and  Mediastinum   Primary hypertension    Chronic, slightly elevated today.   BP 132/90.  She states that she did not take her blood pressure medication today.  Will have her continue amlodipine 5 mg daily and hydrochlorothiazide 25 mg daily.  Discussed limiting salt in her diet and increasing exercise.  Discussed that weight loss can potentially help reverse elevated blood pressure and we can potentially stop blood pressure medication.  Recommend that she start checking her blood pressure at home.  She does not need refills today.  Check CMP, CBC, lipid panel today.  Follow-up in 6 months.      Relevant Orders   CBC   Comprehensive metabolic panel   Lipid panel   Chronic migraine without aura without status migrainosus, not intractable - Primary    Chronic, stable.  She notes that her migraines get worse when her blood pressure is elevated.  Recommend that she take her blood pressure medication regularly.  Continue sumatriptan 50 mg as needed for migraine.  Follow-up if symptoms worsen or with any concerns.      Relevant Medications   SUMAtriptan (IMITREX) 50 MG tablet     Other   Vitamin D deficiency    She has a history of vitamin D deficiency, her last vitamin D was 15.  Recommend that she start taking a vitamin D supplement 2000 units daily.  Will check vitamin D levels today.      Relevant Orders   VITAMIN D 25 Hydroxy (Vit-D Deficiency, Fractures)   Obesity (BMI 30-39.9)    BMI 34.9.  Discussed nutrition, exercise.  Check A1c today.      Relevant Orders   Hemoglobin A1c   Other Visit Diagnoses     Immunization due       Tdap given today   Relevant Orders   Tdap vaccine greater than or equal to 7yo IM (Completed)        Follow up plan: Return in about 6 months (around 12/09/2022) for CPE.

## 2022-06-08 NOTE — Assessment & Plan Note (Signed)
BMI 34.9.  Discussed nutrition, exercise.  Check A1c today.

## 2022-06-08 NOTE — Assessment & Plan Note (Signed)
Chronic, stable.  She notes that her migraines get worse when her blood pressure is elevated.  Recommend that she take her blood pressure medication regularly.  Continue sumatriptan 50 mg as needed for migraine.  Follow-up if symptoms worsen or with any concerns.

## 2022-06-08 NOTE — Assessment & Plan Note (Signed)
She has a history of vitamin D deficiency, her last vitamin D was 15.  Recommend that she start taking a vitamin D supplement 2000 units daily.  Will check vitamin D levels today.

## 2022-06-08 NOTE — Assessment & Plan Note (Addendum)
Chronic, slightly elevated today.  BP 132/90.  She states that she did not take her blood pressure medication today.  Will have her continue amlodipine 5 mg daily and hydrochlorothiazide 25 mg daily.  Discussed limiting salt in her diet and increasing exercise.  Discussed that weight loss can potentially help reverse elevated blood pressure and we can potentially stop blood pressure medication.  Recommend that she start checking her blood pressure at home.  She does not need refills today.  Check CMP, CBC, lipid panel today.  Follow-up in 6 months.

## 2022-06-29 ENCOUNTER — Telehealth: Payer: Self-pay | Admitting: Nurse Practitioner

## 2022-06-29 DIAGNOSIS — I10 Essential (primary) hypertension: Secondary | ICD-10-CM

## 2022-06-29 MED ORDER — HYDROCHLOROTHIAZIDE 25 MG PO TABS: 25.0000 mg | ORAL_TABLET | Freq: Every morning | ORAL | 3 refills | Status: DC

## 2022-06-29 MED ORDER — AMLODIPINE BESYLATE 5 MG PO TABS: 5.0000 mg | ORAL_TABLET | Freq: Every day | ORAL | 3 refills | Status: DC

## 2022-06-29 NOTE — Telephone Encounter (Signed)
Pt needs these meds refilled at [facility] on [address]     amLODipine (NORVASC) 5 MG tablet [[id number]]  hydrochlorothiazide (HYDRODIURIL) 25 MG tablet [[id number]]

## 2022-06-29 NOTE — Telephone Encounter (Signed)
Requesting: amLODipine (NORVASC) 5 MG table , hydrochlorothiazide (HYDRODIURIL) 25 MG tablet  Last Visit: 06/08/2022 Next Visit: Visit date not found Last Refill: 05/23/2022  Please Advise

## 2022-06-29 NOTE — Telephone Encounter (Signed)
Lvm that Rx approved and sent into pharmacy

## 2022-08-08 ENCOUNTER — Encounter: Payer: Self-pay | Admitting: Nurse Practitioner

## 2022-08-08 ENCOUNTER — Telehealth (INDEPENDENT_AMBULATORY_CARE_PROVIDER_SITE_OTHER): Payer: BC Managed Care – PPO | Admitting: Nurse Practitioner

## 2022-08-08 DIAGNOSIS — G43709 Chronic migraine without aura, not intractable, without status migrainosus: Secondary | ICD-10-CM

## 2022-08-08 MED ORDER — NURTEC 75 MG PO TBDP: 75.0000 mg | ORAL_TABLET | ORAL | 1 refills | Status: DC | PRN

## 2022-08-08 NOTE — Patient Instructions (Signed)
It was great to see you!  Stop the imitrex (sumatriptan) and start nurtec 1 tablet every other day as needed for migraine/headache.   Let's follow-up in 6-8 weeks, sooner if you have concerns.  If a referral was placed today, you will be contacted for an appointment. Please note that routine referrals can sometimes take up to 3-4 weeks to process. Please call our office if you haven't heard anything after this time frame.  Take care,  Rodman Pickle, NP

## 2022-08-08 NOTE — Assessment & Plan Note (Signed)
Chronic, not controlled.  Her migraines are happening more frequently and more severe.  She has tried naratriptan, sumatriptan, and Topamax in the past.  Sumatriptan was helping, however is not helping as much anymore.  She denies nausea and vomiting.  Will have her start Nurtec 75 mg every other day as needed for migraine.  This can also be used as preventative.  Follow-up in 6 to 8 weeks.

## 2022-08-08 NOTE — Progress Notes (Signed)
Scheduled

## 2022-08-08 NOTE — Progress Notes (Signed)
[facility] [address] [place] [address] Dept: [phone] Dept Fax: [phone]  Virtual Video Visit  I connected with [name] on 08/08/22 at 11:20 AM EDT by a video enabled telemedicine application and verified that I am speaking with the correct person using two identifiers.  Location patient: Home Location provider: Clinic Persons participating in the virtual visit: Patient; Rodman Pickle, NP; Malena Peer, CMA  I discussed the limitations of evaluation and management by telemedicine and the availability of in person appointments. The patient expressed understanding and agreed to proceed.  Chief Complaint  Patient presents with   Headache    Bad Migranes - getting worse and medication is not helping     SUBJECTIVE:  HPI: Rachael Hahn is a 27 y.o. female who presents to follow-up on migraines. She states that they are getting worse and happening more frequently. She states the imitrex is not helping like it was in the past. She has had 2 headaches in the past week. She missed work today from the migraine. She denies nausea vomiting, however does endorses sensitivity to light and sound.   Patient Active Problem List   Diagnosis Date Noted   Primary hypertension 06/08/2022   Chronic migraine without aura without status migrainosus, not intractable 06/08/2022   Vitamin D deficiency 06/08/2022   Obesity (BMI 30-39.9) 06/08/2022    History reviewed. No pertinent surgical history.  Family History  Problem Relation Age of Onset   Hypertension Mother        off medication   Hypertension Father    Diabetes Maternal Aunt    Hypertension Maternal Aunt     Social History   Tobacco Use   Smoking status: Never   Smokeless tobacco: Never  Vaping Use   Vaping Use: Never used  Substance Use Topics   Alcohol use: Yes    Comment: occasionally   Drug use: Not Currently     Current  Outpatient Medications:    Rimegepant Sulfate (NURTEC) 75 MG TBDP, Take 1 tablet (75 mg total) by mouth every other day as needed (migraine)., Disp: 15 tablet, Rfl: 1   amLODipine (NORVASC) 5 MG tablet, Take 1 tablet (5 mg total) by mouth daily., Disp: 30 tablet, Rfl: 3   hydrochlorothiazide (HYDRODIURIL) 25 MG tablet, Take 1 tablet (25 mg total) by mouth in the morning., Disp: 30 tablet, Rfl: 3  Allergies  Allergen Reactions   Amoxicillin Other (See Comments)    Patient states "yeast infection"     ROS: See pertinent positives and negatives per HPI.  OBSERVATIONS/OBJECTIVE:  VITALS per patient if applicable: There were no vitals filed for this visit. There is no height or weight on file to calculate BMI.    GENERAL: Alert and oriented. Appears well and in no acute distress.  HEENT: Atraumatic. Conjunctiva clear. No obvious abnormalities on inspection of external nose and ears.  NECK: Normal movements of the head and neck.  LUNGS: On inspection, no signs of respiratory distress. Breathing rate appears normal. No obvious gross SOB, gasping or wheezing, and no conversational dyspnea.  CV: No obvious cyanosis.  MS: Moves all visible extremities without noticeable abnormality.  PSYCH/NEURO: Pleasant and cooperative. No obvious depression or anxiety. Speech and thought processing grossly intact.  ASSESSMENT AND PLAN:  Problem List Items Addressed This Visit       Cardiovascular and Mediastinum   Chronic migraine without aura without status migrainosus, not intractable - Primary    Chronic, not controlled.  Her migraines are happening more frequently and more severe.  She has tried naratriptan, sumatriptan, and Topamax in the past.  Sumatriptan was helping, however is not helping as much anymore.  She denies nausea and vomiting.  Will have her start Nurtec 75 mg every other day as needed for migraine.  This can also be used as preventative.  Follow-up in 6 to 8 weeks.       Relevant Medications   Rimegepant Sulfate (NURTEC) 75 MG TBDP     I discussed the assessment and treatment plan with the patient. The patient was provided an opportunity to ask questions and all were answered. The patient agreed with the plan and demonstrated an understanding of the instructions.   The patient was advised to call back or seek an in-person evaluation if the symptoms worsen or if the condition fails to improve as anticipated.   Gerre Scull, NP

## 2022-09-01 ENCOUNTER — Telehealth: Payer: Self-pay | Admitting: Nurse Practitioner

## 2022-09-01 ENCOUNTER — Encounter: Payer: Self-pay | Admitting: Nurse Practitioner

## 2022-09-01 NOTE — Telephone Encounter (Signed)
Pt would like a call back to discuss her change in diet. What was suggested is not working for her. She wants to know another option.

## 2022-09-02 NOTE — Telephone Encounter (Signed)
Lvm to return call

## 2022-09-05 ENCOUNTER — Telehealth: Payer: BC Managed Care – PPO | Admitting: Physician Assistant

## 2022-09-05 NOTE — Progress Notes (Signed)
The patient no-showed for appointment despite this provider sending direct link with no response and waiting for at least 10 minutes from appointment time for patient to join. They will be marked as a NS for this appointment/time.   William Cody Martin, PA-C    

## 2022-09-07 ENCOUNTER — Encounter (INDEPENDENT_AMBULATORY_CARE_PROVIDER_SITE_OTHER): Payer: Self-pay

## 2022-09-08 NOTE — Telephone Encounter (Signed)
I called and spoke with patient and she said the Rachael Hahn message her through Mychart

## 2022-09-21 ENCOUNTER — Telehealth: Payer: Self-pay

## 2022-09-21 ENCOUNTER — Ambulatory Visit (INDEPENDENT_AMBULATORY_CARE_PROVIDER_SITE_OTHER): Payer: BC Managed Care – PPO | Admitting: Nurse Practitioner

## 2022-09-21 ENCOUNTER — Other Ambulatory Visit (HOSPITAL_COMMUNITY): Payer: Self-pay

## 2022-09-21 ENCOUNTER — Encounter: Payer: Self-pay | Admitting: Nurse Practitioner

## 2022-09-21 VITALS — BP 104/80 | HR 64 | Temp 97.9°F | Ht 64.0 in | Wt 204.0 lb

## 2022-09-21 DIAGNOSIS — E559 Vitamin D deficiency, unspecified: Secondary | ICD-10-CM | POA: Diagnosis not present

## 2022-09-21 DIAGNOSIS — G43709 Chronic migraine without aura, not intractable, without status migrainosus: Secondary | ICD-10-CM

## 2022-09-21 DIAGNOSIS — Z6835 Body mass index (BMI) 35.0-35.9, adult: Secondary | ICD-10-CM | POA: Diagnosis not present

## 2022-09-21 DIAGNOSIS — E669 Obesity, unspecified: Secondary | ICD-10-CM

## 2022-09-21 DIAGNOSIS — I10 Essential (primary) hypertension: Secondary | ICD-10-CM

## 2022-09-21 LAB — CBC WITH DIFFERENTIAL/PLATELET
Basophils Absolute: 0 10*3/uL (ref 0.0–0.1)
Basophils Relative: 0.4 % (ref 0.0–3.0)
Eosinophils Absolute: 0.1 10*3/uL (ref 0.0–0.7)
Eosinophils Relative: 1.1 % (ref 0.0–5.0)
HCT: 39.6 % (ref 36.0–46.0)
Hemoglobin: 13.1 g/dL (ref 12.0–15.0)
Lymphocytes Relative: 49.2 % — ABNORMAL HIGH (ref 12.0–46.0)
Lymphs Abs: 2.6 10*3/uL (ref 0.7–4.0)
MCHC: 33 g/dL (ref 30.0–36.0)
MCV: 87.8 fl (ref 78.0–100.0)
Monocytes Absolute: 0.3 10*3/uL (ref 0.1–1.0)
Monocytes Relative: 5.2 % (ref 3.0–12.0)
Neutro Abs: 2.3 10*3/uL (ref 1.4–7.7)
Neutrophils Relative %: 44.1 % (ref 43.0–77.0)
Platelets: 344 10*3/uL (ref 150.0–400.0)
RBC: 4.51 Mil/uL (ref 3.87–5.11)
RDW: 13.3 % (ref 11.5–15.5)
WBC: 5.2 10*3/uL (ref 4.0–10.5)

## 2022-09-21 LAB — COMPREHENSIVE METABOLIC PANEL
ALT: 20 U/L (ref 0–35)
AST: 17 U/L (ref 0–37)
Albumin: 4.4 g/dL (ref 3.5–5.2)
Alkaline Phosphatase: 79 U/L (ref 39–117)
BUN: 16 mg/dL (ref 6–23)
CO2: 28 mEq/L (ref 19–32)
Calcium: 9.7 mg/dL (ref 8.4–10.5)
Chloride: 99 mEq/L (ref 96–112)
Creatinine, Ser: 0.64 mg/dL (ref 0.40–1.20)
GFR: 123.44 mL/min (ref >60.00)
Glucose, Bld: 68 mg/dL — ABNORMAL LOW (ref 70–99)
Potassium: 3.6 mEq/L (ref 3.5–5.1)
Sodium: 136 mEq/L (ref 135–145)
Total Bilirubin: 0.5 mg/dL (ref 0.2–1.2)
Total Protein: 7.2 g/dL (ref 6.0–8.3)

## 2022-09-21 LAB — VITAMIN D 25 HYDROXY (VIT D DEFICIENCY, FRACTURES): VITD: 16.07 ng/mL — ABNORMAL LOW (ref 30.00–100.00)

## 2022-09-21 LAB — LIPID PANEL
Cholesterol: 222 mg/dL — ABNORMAL HIGH (ref 0–200)
HDL: 44.7 mg/dL (ref >39.00)
LDL Cholesterol: 153 mg/dL — ABNORMAL HIGH (ref 0–99)
NonHDL: 177.53
Total CHOL/HDL Ratio: 5
Triglycerides: 124 mg/dL (ref 0.0–149.0)
VLDL: 24.8 mg/dL (ref 0.0–40.0)

## 2022-09-21 LAB — TSH: TSH: 1.21 u[IU]/mL (ref 0.35–5.50)

## 2022-09-21 LAB — HEMOGLOBIN A1C: Hgb A1c MFr Bld: 5.3 % (ref 4.6–6.5)

## 2022-09-21 MED ORDER — VITAMIN D (ERGOCALCIFEROL) 1.25 MG (50000 UNIT) PO CAPS: 50000.0000 [IU] | ORAL_CAPSULE | ORAL | 0 refills | Status: DC

## 2022-09-21 NOTE — Assessment & Plan Note (Signed)
Will check vitamin D levels today and treat based on results.

## 2022-09-21 NOTE — Telephone Encounter (Signed)
Pharmacy Patient Advocate Encounter   Received notification from Pt Calls Messages that prior authorization for Nurtec 75mg  is required/requested.   Insurance verification completed.   The patient is insured through [employer] .   Per test claim: PA required; PA submitted to [employer] via Fax Key/confirmation #/EOC [id number] [id number] Status is pending

## 2022-09-21 NOTE — Assessment & Plan Note (Addendum)
BMI 35.  She has lost 5 pounds in the last few weeks.  Congratulated her on this.  She states that she did have a severe migraine after not eating and doing intermittent fasting.  Discussed that this may not work for her and that she should eat small meals throughout the day.  She should also make sure she is drinking plenty of water.  She has a family history of diabetes and is concerned about her sugars, will check A1c today.  Follow-up in 3 months or sooner with concerns.

## 2022-09-21 NOTE — Telephone Encounter (Signed)
I called pharmacy regarding Nurtec Rx that was prescribed to patient on 08/08/2022. Pharmacist said that Rx needs a prior auth and will send it through Cover My Meds.   Prior Auth is needed on Nurtec 75mg 

## 2022-09-21 NOTE — Addendum Note (Signed)
Addended by: Rodman Pickle A on: 09/21/2022 04:12 PM   Modules accepted: Orders

## 2022-09-21 NOTE — Assessment & Plan Note (Signed)
Chronic, ongoing.  She states that she is still having migraines about twice a week however they have slightly improved since last visit.  She did have one severe migraine on August 3 that caused her to lose her vision for a few seconds and caused her to be very anxious.  She also states that she did not eat earlier that day at all.  Will check CMP, CBC, TSH as noted above.  Will also call her pharmacy to find out about the Nurtec prescription since she has not picked this up yet.  Will also place a referral to neurology for headaches.  Follow-up in 3 months or sooner with concerns.

## 2022-09-21 NOTE — Progress Notes (Signed)
Established Patient Office Visit  Subjective   Patient ID: Rachael Hahn, female    DOB: [birth year]  Age: 27 y.o. MRN: [id number]  Chief Complaint  Patient presents with   Migraine    Follow up, passed out on 09-10-22    HPI  Rachael Hahn is here to follow-up on migraines.   She states that overall they are doing slightly better, however she still having them about twice a week.  She states the pharmacy never called her about her Nurtec prescription so she did not start this.  She states that she did have a severe headache on 09/10/2022.  She had not eaten during the day and then she had a mild headache that went from bad to worse.  She was having severe pain at one point and states that her vision went black and she started crying.  Her fianc was trying to calm her down.  She states that she never lost consciousness.  She states that her vision did come back after a few minutes.  This has not happened since then.  She has been trying to lose weight and had tried intermittent fasting.  She states that she is trying to cut back and limit carbohydrates and fried foods.  She states that she has lost 5 pounds since making these changes.    ROS See pertinent positives and negatives per HPI.    Objective:     BP 104/80 (BP Location: Left Arm)   Pulse 64   Temp 97.9 F (36.6 C)   Ht 5\' 4"  (1.626 m)   Wt 204 lb (92.5 kg)   LMP 09/06/2022 (Exact Date)   SpO2 97%   BMI 35.02 kg/m  BP Readings from Last 3 Encounters:  09/21/22 104/80  06/08/22 (!) 132/90  05/23/22 (!) 147/83   Wt Readings from Last 3 Encounters:  09/21/22 204 lb (92.5 kg)  06/08/22 203 lb 9.6 oz (92.4 kg)  05/27/21 212 lb 6.4 oz (96.3 kg)      Physical Exam Vitals and nursing note reviewed.  Constitutional:      General: She is not in acute distress.    Appearance: Normal appearance.  HENT:     Head: Normocephalic.  Eyes:     Conjunctiva/sclera: Conjunctivae normal.  Cardiovascular:     Rate  and Rhythm: Normal rate and regular rhythm.     Pulses: Normal pulses.     Heart sounds: Normal heart sounds.  Pulmonary:     Effort: Pulmonary effort is normal.     Breath sounds: Normal breath sounds.  Musculoskeletal:     Cervical back: Normal range of motion.  Skin:    General: Skin is warm.  Neurological:     General: No focal deficit present.     Mental Status: She is alert and oriented to person, place, and time.     Motor: No weakness.     Gait: Gait normal.  Psychiatric:        Mood and Affect: Mood normal.        Behavior: Behavior normal.        Thought Content: Thought content normal.        Judgment: Judgment normal.      Assessment & Plan:   Problem List Items Addressed This Visit       Cardiovascular and Mediastinum   Primary hypertension    Chronic, stable.  BP well-controlled today at 104/80.  Continue amlodipine 5 mg daily and HCTZ 25 mg daily.  Check CMP, CBC, TSH, lipid panel today.      Relevant Orders   CBC with Differential/Platelet   Lipid panel   Comprehensive metabolic panel   TSH   Chronic migraine without aura without status migrainosus, not intractable - Primary    Chronic, ongoing.  She states that she is still having migraines about twice a week however they have slightly improved since last visit.  She did have one severe migraine on August 3 that caused her to lose her vision for a few seconds and caused her to be very anxious.  She also states that she did not eat earlier that day at all.  Will check CMP, CBC, TSH as noted above.  Will also call her pharmacy to find out about the Nurtec prescription since she has not picked this up yet.  Will also place a referral to neurology for headaches.  Follow-up in 3 months or sooner with concerns.      Relevant Orders   Ambulatory referral to Neurology     Other   Vitamin D deficiency    Will check vitamin D levels today and treat based on results      Relevant Orders   VITAMIN D 25 Hydroxy  (Vit-D Deficiency, Fractures)   Obesity (BMI 30-39.9)    BMI 35.  She has lost 5 pounds in the last few weeks.  Congratulated her on this.  She states that she did have a severe migraine after not eating and doing intermittent fasting.  Discussed that this may not work for her and that she should eat small meals throughout the day.  She should also make sure she is drinking plenty of water.  She has a family history of diabetes and is concerned about her sugars, will check A1c today.  Follow-up in 3 months or sooner with concerns.      Relevant Orders   Hemoglobin A1c    Return in about 3 months (around 12/22/2022) for CPE.    Gerre Scull, NP

## 2022-09-21 NOTE — Patient Instructions (Signed)
It was great to see you!  We are checking your labs today and will let you know the results via mychart/phone.   We are following up on the nurtec  Make sure you are eating regularly and drinking plenty of water  Let's follow-up in 3 months, sooner if you have concerns.  If a referral was placed today, you will be contacted for an appointment. Please note that routine referrals can sometimes take up to 3-4 weeks to process. Please call our office if you haven't heard anything after this time frame.  Take care,  Rodman Pickle, NP

## 2022-09-21 NOTE — Assessment & Plan Note (Addendum)
Chronic, stable.  BP well-controlled today at 104/80.  Continue amlodipine 5 mg daily and HCTZ 25 mg daily.  Check CMP, CBC, TSH, lipid panel today.

## 2022-09-26 ENCOUNTER — Other Ambulatory Visit (HOSPITAL_COMMUNITY): Payer: Self-pay

## 2022-09-26 NOTE — Telephone Encounter (Signed)
Patient notified that Rx of Nurtec approved

## 2022-09-26 NOTE — Telephone Encounter (Signed)
Pharmacy Patient Advocate Encounter  Received notification from [facility] [employer] that Prior Authorization for Nurtec 75mg  has been APPROVED from 08/24/22 to 09/23/23   PA #/Case ID/Reference #: [id number]  Approval letter indexed to media tab

## 2022-09-28 ENCOUNTER — Other Ambulatory Visit: Payer: Self-pay | Admitting: Nurse Practitioner

## 2022-09-28 DIAGNOSIS — I10 Essential (primary) hypertension: Secondary | ICD-10-CM

## 2022-10-11 ENCOUNTER — Encounter: Payer: Self-pay | Admitting: Nurse Practitioner

## 2022-10-26 ENCOUNTER — Other Ambulatory Visit: Payer: Self-pay | Admitting: Nurse Practitioner

## 2022-10-26 ENCOUNTER — Encounter: Payer: Self-pay | Admitting: Nurse Practitioner

## 2022-10-26 DIAGNOSIS — I10 Essential (primary) hypertension: Secondary | ICD-10-CM

## 2022-10-26 MED ORDER — HYDROCHLOROTHIAZIDE 25 MG PO TABS: 25.0000 mg | ORAL_TABLET | Freq: Every morning | ORAL | 3 refills | Status: DC

## 2022-10-28 DIAGNOSIS — Z319 Encounter for procreative management, unspecified: Secondary | ICD-10-CM | POA: Diagnosis not present

## 2022-10-28 DIAGNOSIS — Z31430 Encounter of female for testing for genetic disease carrier status for procreative management: Secondary | ICD-10-CM | POA: Diagnosis not present

## 2022-11-01 ENCOUNTER — Telehealth: Payer: BC Managed Care – PPO | Admitting: Physician Assistant

## 2022-11-01 DIAGNOSIS — R0789 Other chest pain: Secondary | ICD-10-CM

## 2022-11-01 NOTE — Progress Notes (Signed)
Virtual Visit Consent   Rachael Hahn, you are scheduled for a virtual visit with a [employer] provider today. Just as with appointments in the office, your consent must be obtained to participate. Your consent will be active for this visit and any virtual visit you may have with one of our providers in the next 365 days. If you have a MyChart account, a copy of this consent can be sent to you electronically.  As this is a virtual visit, video technology does not allow for your provider to perform a traditional examination. This may limit your provider's ability to fully assess your condition. If your provider identifies any concerns that need to be evaluated in person or the need to arrange testing (such as labs, EKG, etc.), we will make arrangements to do so. Although advances in technology are sophisticated, we cannot ensure that it will always work on either your end or our end. If the connection with a video visit is poor, the visit may have to be switched to a telephone visit. With either a video or telephone visit, we are not always able to ensure that we have a secure connection.  By engaging in this virtual visit, you consent to the provision of healthcare and authorize for your insurance to be billed (if applicable) for the services provided during this visit. Depending on your insurance coverage, you may receive a charge related to this service.  I need to obtain your verbal consent now. Are you willing to proceed with your visit today? Rachael Hahn has provided verbal consent on 11/01/2022 for a virtual visit (video or telephone). Rachael Hahn, [place]  Date: 11/01/2022 2:58 PM  Virtual Visit via Video Note   I, Rachael Hahn, connected with  Rachael Hahn  ([id number], 12/12/1995) on 11/01/22 at  2:45 PM EDT by a video-enabled telemedicine application and verified that I am speaking with the correct person using two identifiers.  Location: Patient: Virtual Visit  Location Patient: Home Provider: Virtual Visit Location Provider: Home Office   I discussed the limitations of evaluation and management by telemedicine and the availability of in person appointments. The patient expressed understanding and agreed to proceed.    History of Present Illness: Rachael Hahn is a 27 y.o. who identifies as a female who was assigned female at birth, and is being seen today for symptoms of chest pressure and discomfort that she describes as sternal, made it harder for her to sleep. Denies SOB or wheezing. Denies exertional component. Worse when laying down. Denies noting any heartburn or indigestion. Has not taken anything OTC. Has history of chronic hypertension for which she is prescribed hydrochlorothiazide 25 mg daily which she has been taking and Amlodipine 5 mg once daily that she was taking, but has been out of medication for 2 days.  BP Readings from Last 3 Encounters:  09/21/22 104/80  06/08/22 (!) 132/90  05/23/22 (!) 147/83   Home check 130/91  HPI: HPI  Problems:  Patient Active Problem List   Diagnosis Date Noted   Primary hypertension 06/08/2022   Chronic migraine without aura without status migrainosus, not intractable 06/08/2022   Vitamin D deficiency 06/08/2022   Obesity (BMI 30-39.9) 06/08/2022    Allergies:  Allergies  Allergen Reactions   Amoxicillin Other (See Comments)    Patient states "yeast infection"    Penicillin G     Other Reaction(s): yeast inf   Medications:  Current Outpatient Medications:    amLODipine (NORVASC) 5 MG tablet, TAKE  1 TABLET(5 MG) BY MOUTH DAILY, Disp: 90 tablet, Rfl: 1   hydrochlorothiazide (HYDRODIURIL) 25 MG tablet, Take 1 tablet (25 mg total) by mouth in the morning., Disp: 30 tablet, Rfl: 3   Vitamin D, Ergocalciferol, (DRISDOL) 1.25 MG (50000 UNIT) CAPS capsule, Take 1 capsule (50,000 Units total) by mouth every 7 (seven) days., Disp: 12 capsule, Rfl: 0  Observations/Objective: Patient is  well-developed, well-nourished in no acute distress.  Resting comfortably at home.  Head is normocephalic, atraumatic.  No labored breathing. Speech is clear and coherent with logical content.  Patient is alert and oriented at baseline.   Assessment and Plan: 1. Atypical chest pain  Non exertion but central/sternal. Starting this morning at work and continued since without improvement or worsening. No associated cardiopulmonary symptoms but denies trauma/injury, heartburn, increased stress or URI symptoms. BP mildly elevated at 130/91 but not at level to be cause of symptoms; rather likely effect of pain and not taking her HCTZ. Want her to be evaluated in person this afternoon by PCP or UC. ER precautions reviewed. Patient voiced understanding an agreement.   Follow Up Instructions: I discussed the assessment and treatment plan with the patient. The patient was provided an opportunity to ask questions and all were answered. The patient agreed with the plan and demonstrated an understanding of the instructions.  A copy of instructions were sent to the patient via MyChart unless otherwise noted below.   The patient was advised to call back or seek an in-person evaluation if the symptoms worsen or if the condition fails to improve as anticipated.  Time:  I spent 10 minutes with the patient via telehealth technology discussing the above problems/concerns.    Rachael Climes, PA-C

## 2022-11-01 NOTE — Patient Instructions (Signed)
  [name], thank you for joining Piedad Climes, PA-C for today's virtual visit.  While this provider is not your primary care provider (PCP), if your PCP is located in our provider database this encounter information will be shared with them immediately following your visit.   A [facility] MyChart account gives you access to today's visit and all your visits, tests, and labs performed at [facility] " click here if you don't have a [facility] MyChart account or go to [email or web address]  Consent: (Patient) Rachael Hahn provided verbal consent for this virtual visit at the beginning of the encounter.  Current Medications:  Current Outpatient Medications:    amLODipine (NORVASC) 5 MG tablet, TAKE 1 TABLET(5 MG) BY MOUTH DAILY, Disp: 90 tablet, Rfl: 1   hydrochlorothiazide (HYDRODIURIL) 25 MG tablet, Take 1 tablet (25 mg total) by mouth in the morning., Disp: 30 tablet, Rfl: 3   Rimegepant Sulfate (NURTEC) 75 MG TBDP, Take 1 tablet (75 mg total) by mouth every other day as needed (migraine). (Patient not taking: Reported on 09/21/2022), Disp: 15 tablet, Rfl: 1   Vitamin D, Ergocalciferol, (DRISDOL) 1.25 MG (50000 UNIT) CAPS capsule, Take 1 capsule (50,000 Units total) by mouth every 7 (seven) days., Disp: 12 capsule, Rfl: 0   Medications ordered in this encounter:  No orders of the defined types were placed in this encounter.    *If you need refills on other medications prior to your next appointment, please contact your pharmacy*  Follow-Up: Call back or seek an in-person evaluation if the symptoms worsen or if the condition fails to improve as anticipated.  [facility] [phone]  Other Instructions If PCP unavailable, use the link below to be evaluated in person this afternoon. If any worsening symptoms or any shortness of breath, you need an ER evaluation ASAP.   If you have been instructed to have an in-person evaluation today  at a local Urgent Care facility, please use the link below. It will take you to a list of all of our available [facility], including address, phone number and hours of operation. Please do not delay care.  [facility]  If you or a family member do not have a primary care provider, use the link below to schedule a visit and establish care. When you choose a [facility] primary care physician or advanced practice provider, you gain a long-term partner in health. Find a Primary Care Provider  Learn more about [facility]'s in-office and virtual care options:  - Get Care Now

## 2022-11-02 ENCOUNTER — Encounter: Payer: Self-pay | Admitting: Nurse Practitioner

## 2022-11-02 ENCOUNTER — Ambulatory Visit: Payer: BC Managed Care – PPO | Admitting: Nurse Practitioner

## 2022-11-02 VITALS — BP 110/64 | HR 56 | Temp 97.1°F | Ht 64.0 in | Wt 204.0 lb

## 2022-11-02 DIAGNOSIS — R002 Palpitations: Secondary | ICD-10-CM

## 2022-11-02 LAB — CBC
HCT: 39.9 % (ref 36.0–46.0)
Hemoglobin: 13.1 g/dL (ref 12.0–15.0)
MCHC: 32.8 g/dL (ref 30.0–36.0)
MCV: 88.4 fl (ref 78.0–100.0)
Platelets: 338 10*3/uL (ref 150.0–400.0)
RBC: 4.51 Mil/uL (ref 3.87–5.11)
RDW: 13.4 % (ref 11.5–15.5)
WBC: 5.5 10*3/uL (ref 4.0–10.5)

## 2022-11-02 LAB — COMPREHENSIVE METABOLIC PANEL
ALT: 19 U/L (ref 0–35)
AST: 13 U/L (ref 0–37)
Albumin: 4.1 g/dL (ref 3.5–5.2)
Alkaline Phosphatase: 81 U/L (ref 39–117)
BUN: 17 mg/dL (ref 6–23)
CO2: 28 mEq/L (ref 19–32)
Calcium: 9.5 mg/dL (ref 8.4–10.5)
Chloride: 102 mEq/L (ref 96–112)
Creatinine, Ser: 0.81 mg/dL (ref 0.40–1.20)
GFR: 101.31 mL/min (ref >60.00)
Glucose, Bld: 77 mg/dL (ref 70–99)
Potassium: 3.6 mEq/L (ref 3.5–5.1)
Sodium: 138 mEq/L (ref 135–145)
Total Bilirubin: 0.3 mg/dL (ref 0.2–1.2)
Total Protein: 6.7 g/dL (ref 6.0–8.3)

## 2022-11-02 LAB — TSH: TSH: 1.46 u[IU]/mL (ref 0.35–5.50)

## 2022-11-02 LAB — MAGNESIUM: Magnesium: 2 mg/dL (ref 1.5–2.5)

## 2022-11-02 NOTE — Patient Instructions (Signed)
It was great to see you!  Decrease the amount of caffeine you are drinking  Increase water and start drinking 1-2 gatorades daily for the next few days  Stop the hydrochlorothiazide and pick up the amlodipine for your blood pressure. Keep checking your blood pressure at home and send me a message if it goes above 140/90 for several days in a row.   We are checking your labs today and will let you know the results via mychart/phone.   Let's follow-up in 2 weeks, sooner if you have concerns.  If a referral was placed today, you will be contacted for an appointment. Please note that routine referrals can sometimes take up to 3-4 weeks to process. Please call our office if you haven't heard anything after this time frame.  Take care,  Rodman Pickle, NP

## 2022-11-02 NOTE — Progress Notes (Signed)
EKG interpreted by me on 11/02/22 showed normal sinus rhythm with a heart rate of 61. No ST or T wave changes.

## 2022-11-02 NOTE — Assessment & Plan Note (Addendum)
She has been experiencing palpitations with dizziness intermittently over the past few days. She did miss 2 days of her BP medications and has not taken her amlodipine in a few weeks. EKG shows normal sinus rhythm with a heart rate of 61. No ST or T wave changes. Will have her stop the hydrochlorothiazide and just keep taking amlodipine 5mg  daily. Encourage fluids, and can add in gatorade. Check CMP, CBC, magnesium, TSH today. Limit caffeine. Follow-up in 2 weeks.

## 2022-11-02 NOTE — Progress Notes (Signed)
Acute Office Visit  Subjective:     Patient ID: Rachael Hahn, female    DOB: [birth year], 27 y.o.   MRN: [id number]  Chief Complaint  Patient presents with   Dizziness    Rapid palpitations, balance issues, dehydration at work yesterday   HPI:  Patient is in today for dizziness, palpitations that started Monday night at work.   She states that at work on Monday night, she started having her heart beating fast. She thought her blood pressure might be elevated. She took vinegar which made her feel worse. She then went home and tried to get some rest. She states that she was doing things around the house and didn't have any symptoms. Then she went to lay down and noticed it got worse again. She denies chest pain. This is associated with dizziness. She also is feeling dehydrated. She checked her blood pressure yesterday and it was 130s/90s. She has also been out of her blood pressure medications for the last few days. She denies shortness of breath and sweating.   ROS See pertinent positives and negatives per HPI.     Objective:    BP 110/64 (BP Location: Left Arm)   Pulse (!) 56   Temp (!) 97.1 F (36.2 C)   Ht 5\' 4"  (1.626 m)   Wt 204 lb (92.5 kg)   LMP 10/20/2022 (Exact Date)   SpO2 96%   BMI 35.02 kg/m    Orthostatic VS for the past 72 hrs (Last 3 readings):  Orthostatic BP Patient Position BP Location Orthostatic Pulse  11/02/22 1040 118/79 Standing -- 69  11/02/22 1039 121/77 Sitting -- 62  11/02/22 1038 120/67 Supine -- 58  11/02/22 1016 -- -- Left Arm --    Physical Exam Vitals and nursing note reviewed.  Constitutional:      General: She is not in acute distress.    Appearance: Normal appearance.  HENT:     Head: Normocephalic.  Eyes:     Conjunctiva/sclera: Conjunctivae normal.  Cardiovascular:     Rate and Rhythm: Normal rate and regular rhythm.     Pulses: Normal pulses.     Heart sounds: Normal heart sounds.  Pulmonary:     Effort: Pulmonary  effort is normal.     Breath sounds: Normal breath sounds.  Musculoskeletal:     Cervical back: Normal range of motion and neck supple. No tenderness.  Lymphadenopathy:     Cervical: No cervical adenopathy.  Skin:    General: Skin is warm.  Neurological:     General: No focal deficit present.     Mental Status: She is alert and oriented to person, place, and time.  Psychiatric:        Mood and Affect: Mood normal.        Behavior: Behavior normal.        Thought Content: Thought content normal.        Judgment: Judgment normal.      Assessment & Plan:   Problem List Items Addressed This Visit       Other   Palpitation - Primary    She has been experiencing palpitations with dizziness intermittently over the past few days. She did miss 2 days of her BP medications and has not taken her amlodipine in a few weeks. EKG shows normal sinus rhythm with a heart rate of 61. No ST or T wave changes. Will have her stop the hydrochlorothiazide and just keep taking amlodipine 5mg  daily. Encourage fluids, and  can add in gatorade. Check CMP, CBC, magnesium, TSH today. Limit caffeine. Follow-up in 2 weeks.       Relevant Orders   CBC   Comprehensive metabolic panel   Magnesium   EKG 12-Lead (Completed)   TSH    No orders of the defined types were placed in this encounter.   Return in about 2 weeks (around 11/16/2022) for palpitations, htn.  Gerre Scull, NP

## 2022-11-16 ENCOUNTER — Ambulatory Visit: Payer: BC Managed Care – PPO | Admitting: Nurse Practitioner

## 2022-11-29 ENCOUNTER — Other Ambulatory Visit: Payer: Self-pay | Admitting: Nurse Practitioner

## 2022-11-29 DIAGNOSIS — I10 Essential (primary) hypertension: Secondary | ICD-10-CM

## 2022-12-08 ENCOUNTER — Ambulatory Visit
Admission: RE | Admit: 2022-12-08 | Discharge: 2022-12-08 | Disposition: A | Discharge status: Home / Self Care | Payer: BC Managed Care – PPO | Source: Ambulatory Visit | Attending: Internal Medicine | Admitting: Internal Medicine

## 2022-12-08 ENCOUNTER — Encounter: Payer: Self-pay | Admitting: Nurse Practitioner

## 2022-12-08 VITALS — BP 119/78 | HR 65 | Temp 98.5°F | Resp 16

## 2022-12-08 DIAGNOSIS — G43809 Other migraine, not intractable, without status migrainosus: Secondary | ICD-10-CM

## 2022-12-08 DIAGNOSIS — J309 Allergic rhinitis, unspecified: Secondary | ICD-10-CM

## 2022-12-08 MED ORDER — SUMATRIPTAN SUCCINATE 25 MG PO TABS: ORAL_TABLET | ORAL | 0 refills | Status: DC

## 2022-12-08 MED ORDER — KETOROLAC TROMETHAMINE 60 MG/2ML IM SOLN
60.0000 mg | Freq: Once | INTRAMUSCULAR | Status: AC
  Administered 2022-12-08: 60 mg via INTRAMUSCULAR

## 2022-12-08 MED ORDER — EXCEDRIN MIGRAINE 250-250-65 MG PO TABS: 1.0000 | ORAL_TABLET | Freq: Four times a day (QID) | ORAL | 0 refills | Status: DC | PRN

## 2022-12-08 NOTE — ED Provider Notes (Signed)
[facility]  Note:  This document was prepared using Conservation officer, historic buildings and may include unintentional dictation errors.  MRN: [id number] DOB: 1995/05/15  Subjective:   Rachael Hahn is a 27 y.o. female presenting for 1 day history of a migraine headache with pressure behind her eyes, photophobia, phonophobia.  Has used over-the-counter headache medications with minimal relief.  Has a history of migraines, has an appointment scheduled with a neurologist in 3 weeks.  Has tried multiple migraine medications in the past.  Cannot recall any specific 1 that works.  No fever, neck pain, neck stiffness, weakness, numbness or tingling.  No history of cerebrovascular disease, neurologic disorders apart from her migraines.  No current facility-administered medications for this encounter.  Current Outpatient Medications:    acetaminophen (TYLENOL) 650 MG CR tablet, Take 650 mg by mouth every 8 (eight) hours as needed for pain., Disp: , Rfl:    amLODipine (NORVASC) 5 MG tablet, TAKE 1 TABLET(5 MG) BY MOUTH DAILY, Disp: 90 tablet, Rfl: 1   Vitamin D, Ergocalciferol, (DRISDOL) 1.25 MG (50000 UNIT) CAPS capsule, Take 1 capsule (50,000 Units total) by mouth every 7 (seven) days., Disp: 12 capsule, Rfl: 0   Allergies  Allergen Reactions   Amoxicillin Other (See Comments)    Patient states "yeast infection"    Penicillin G     Other Reaction(s): yeast inf    Past Medical History:  Diagnosis Date   Arthritis    Depression    Hypertension    Migraine    Trichomoniasis      History reviewed. No pertinent surgical history.  Family History  Problem Relation Age of Onset   Hypertension Mother        off medication   Hypertension Father    Diabetes Maternal Aunt    Hypertension Maternal Aunt     Social History   Tobacco Use   Smoking status: Never   Smokeless tobacco: Never  Vaping Use   Vaping status: Never Used  Substance Use Topics   Alcohol  use: Yes    Comment: occasionally   Drug use: Not Currently    ROS   Objective:   Vitals: BP 119/78 (BP Location: Left Arm)   Pulse 65   Temp 98.5 F (36.9 C) (Oral)   Resp 16   LMP  (Within Months) Comment: 1 month  SpO2 98%   Physical Exam Constitutional:      General: She is not in acute distress.    Appearance: Normal appearance. She is well-developed and normal weight. She is not ill-appearing, toxic-appearing or diaphoretic.  HENT:     Head: Normocephalic and atraumatic.     Right Ear: Tympanic membrane, ear canal and external ear normal. No drainage or tenderness. No middle ear effusion. There is no impacted cerumen. Tympanic membrane is not erythematous or bulging.     Left Ear: Tympanic membrane, ear canal and external ear normal. No drainage or tenderness.  No middle ear effusion. There is no impacted cerumen. Tympanic membrane is not erythematous or bulging.     Nose: Nose normal. No congestion or rhinorrhea.     Mouth/Throat:     Mouth: Mucous membranes are moist. No oral lesions.     Pharynx: No pharyngeal swelling, oropharyngeal exudate, posterior oropharyngeal erythema or uvula swelling.     Tonsils: No tonsillar exudate or tonsillar abscesses.  Eyes:     General: No scleral icterus.       Right eye: No discharge.  Left eye: No discharge.     Extraocular Movements: Extraocular movements intact.     Right eye: Normal extraocular motion.     Left eye: Normal extraocular motion.     Conjunctiva/sclera: Conjunctivae normal.     Pupils: Pupils are equal, round, and reactive to light.  Neck:     Meningeal: Brudzinski's sign and Kernig's sign absent.  Cardiovascular:     Rate and Rhythm: Normal rate.  Pulmonary:     Effort: Pulmonary effort is normal.  Musculoskeletal:     Cervical back: Normal range of motion and neck supple. No rigidity.  Lymphadenopathy:     Cervical: No cervical adenopathy.  Skin:    General: Skin is warm and dry.  Neurological:      General: No focal deficit present.     Mental Status: She is alert and oriented to person, place, and time.     Cranial Nerves: No cranial nerve deficit, dysarthria or facial asymmetry.     Motor: No weakness or pronator drift.     Coordination: Romberg sign negative. Coordination normal. Finger-Nose-Finger Test and Heel to [facility] Test normal. Rapid alternating movements normal.     Gait: Gait and tandem walk normal.     Deep Tendon Reflexes: Reflexes normal.  Psychiatric:        Mood and Affect: Mood normal.        Speech: Speech normal.        Behavior: Behavior normal.        Thought Content: Thought content normal.        Judgment: Judgment normal.    IM Toradol 60 mg administered in clinic.  Assessment and Plan :   PDMP not reviewed this encounter.  1. Other migraine without status migrainosus, not intractable   2. Allergic rhinitis, unspecified seasonality, unspecified trigger    Discussed migraine management in clinic.  Maintain appointment with neurologist.  Can use Excedrin and/or sumatriptan at home for continued management of her migraines.  Advised that she monitor for and avoid triggers.  Otherwise no signs of an acute encephalopathy here in clinic.  Counseled patient on potential for adverse effects with medications prescribed/recommended today, ER and return-to-clinic precautions discussed, patient verbalized understanding.    Wallis Bamberg, [place] 12/08/22 1527

## 2022-12-08 NOTE — ED Triage Notes (Signed)
Pt reports headache and pain behind eyes started today. "Arthritis pill" gives relief, last dose 4:45 am today.

## 2022-12-21 ENCOUNTER — Ambulatory Visit: Payer: BC Managed Care – PPO | Admitting: Nurse Practitioner

## 2022-12-21 ENCOUNTER — Telehealth: Payer: Self-pay

## 2022-12-21 ENCOUNTER — Telehealth (INDEPENDENT_AMBULATORY_CARE_PROVIDER_SITE_OTHER): Payer: BC Managed Care – PPO | Admitting: Nurse Practitioner

## 2022-12-21 ENCOUNTER — Encounter: Payer: Self-pay | Admitting: Nurse Practitioner

## 2022-12-21 ENCOUNTER — Telehealth: Payer: Self-pay | Admitting: Nurse Practitioner

## 2022-12-21 DIAGNOSIS — G43709 Chronic migraine without aura, not intractable, without status migrainosus: Secondary | ICD-10-CM | POA: Diagnosis not present

## 2022-12-21 NOTE — Assessment & Plan Note (Signed)
Chronic, not controlled. She has not started the nurtec yet as it was unavailable at the pharmacy. Upon review, the prior authorization was approved. Will reach out to her pharmacy and follow-up on the nurtec. She should take 75mg  every other day as needed for migraine. She has been having worsening and more frequent migraines again. She has triggers at her job, including bright lights and smells. She has an appointment with neurology on 12/29/22 and was encouraged to keep this appointment. If nurtec is unavailable, will send in different medication.

## 2022-12-21 NOTE — Telephone Encounter (Signed)
Noted  

## 2022-12-21 NOTE — Telephone Encounter (Signed)
I called and spoke with patient and she is aware

## 2022-12-21 NOTE — Telephone Encounter (Signed)
I did not count as no show

## 2022-12-21 NOTE — Progress Notes (Signed)
[facility] [address] [place] [address] Dept: [phone] Dept Fax: [phone]  Virtual Video Visit  I connected with [name] on 12/21/22 at 11:00 AM EST by a video enabled telemedicine application and verified that I am speaking with the correct person using two identifiers.  Location patient: Home Location provider: Clinic Persons participating in the virtual visit: Patient; Rodman Pickle, NP; Malena Peer, CMA  I discussed the limitations of evaluation and management by telemedicine and the availability of in person appointments. The patient expressed understanding and agreed to proceed.  Chief Complaint  Patient presents with   Migraine    Worsening and more frequent migraines.     SUBJECTIVE:  HPI: Rachael Hahn is a 27 y.o. female who presents with migraines.  She states that her migraines have worsened. She states that when she is at her first job, she is around machines, bright lights, and weird smells which triggers her migraines. She has been having migraines almost every Monday-Friday. When she is at home or her second job, she does not tend to get these migraines. She denies nausea and vomiting. She has been taking over the counter migraine medication which does help. She states the pharmacy did not have the nurtec ready for her to pick up and has not started this.   Patient Active Problem List   Diagnosis Date Noted   Palpitation 11/02/2022   Primary hypertension 06/08/2022   Chronic migraine without aura without status migrainosus, not intractable 06/08/2022   Vitamin D deficiency 06/08/2022   Obesity (BMI 30-39.9) 06/08/2022    No past surgical history on file.  Family History  Problem Relation Age of Onset   Hypertension Mother        off medication   Hypertension Father    Diabetes Maternal Aunt    Hypertension Maternal Aunt     Social History   Tobacco Use    Smoking status: Never   Smokeless tobacco: Never  Vaping Use   Vaping status: Never Used  Substance Use Topics   Alcohol use: Yes    Comment: occasionally   Drug use: Not Currently     Current Outpatient Medications:    acetaminophen (TYLENOL) 650 MG CR tablet, Take 650 mg by mouth every 8 (eight) hours as needed for pain., Disp: , Rfl:    amLODipine (NORVASC) 5 MG tablet, TAKE 1 TABLET(5 MG) BY MOUTH DAILY, Disp: 90 tablet, Rfl: 1   aspirin-acetaminophen-caffeine (EXCEDRIN MIGRAINE) 250-250-65 MG tablet, Take 1 tablet by mouth every 6 (six) hours as needed for headache or migraine., Disp: 30 tablet, Rfl: 0   SUMAtriptan (IMITREX) 25 MG tablet, Take 1 tablet at the start of a migraine. If the pain persists after 2 hours take 1 more tablet. Do not exceed 2 doses in 24 hours., Disp: 10 tablet, Rfl: 0   Vitamin D, Ergocalciferol, (DRISDOL) 1.25 MG (50000 UNIT) CAPS capsule, Take 1 capsule (50,000 Units total) by mouth every 7 (seven) days., Disp: 12 capsule, Rfl: 0  Allergies  Allergen Reactions   Amoxicillin Other (See Comments)    Patient states "yeast infection"    Penicillin G     Other Reaction(s): yeast inf    ROS: See pertinent positives and negatives per HPI.  OBSERVATIONS/OBJECTIVE:  VITALS per patient if applicable: There were no vitals filed for this visit. There is no height or weight on file to calculate BMI.    GENERAL: Alert and oriented. Appears well and in  no acute distress.  HEENT: Atraumatic. Conjunctiva clear. No obvious abnormalities on inspection of external nose and ears.  NECK: Normal movements of the head and neck.  LUNGS: On inspection, no signs of respiratory distress. Breathing rate appears normal. No obvious gross SOB, gasping or wheezing, and no conversational dyspnea.  CV: No obvious cyanosis.  MS: Moves all visible extremities without noticeable abnormality.  PSYCH/NEURO: Pleasant and cooperative. No obvious depression or anxiety. Speech and  thought processing grossly intact.  ASSESSMENT AND PLAN:  Problem List Items Addressed This Visit       Cardiovascular and Mediastinum   Chronic migraine without aura without status migrainosus, not intractable - Primary    Chronic, not controlled. She has not started the nurtec yet as it was unavailable at the pharmacy. Upon review, the prior authorization was approved. Will reach out to her pharmacy and follow-up on the nurtec. She should take 75mg  every other day as needed for migraine. She has been having worsening and more frequent migraines again. She has triggers at her job, including bright lights and smells. She has an appointment with neurology on 12/29/22 and was encouraged to keep this appointment. If nurtec is unavailable, will send in different medication.         I discussed the assessment and treatment plan with the patient. The patient was provided an opportunity to ask questions and all were answered. The patient agreed with the plan and demonstrated an understanding of the instructions.   The patient was advised to call back or seek an in-person evaluation if the symptoms worsen or if the condition fails to improve as anticipated.   Gerre Scull, NP

## 2022-12-21 NOTE — Telephone Encounter (Signed)
Pt had two appt's for 12/21/22 with Lauren. I called her, she wanted the 9:20 OV cancelled and she would keep her 11:00 mychart. She would not be able to make 9:20 on time, no letter

## 2022-12-21 NOTE — Patient Instructions (Signed)
It was great to see you!  I am reaching out to your pharmacy to follow-up on your nurtec and will let you what they say.   Keep your appointment with neurology on 12/29/22.   Let's follow-up if symptoms worsen or any concerns.   Take care,  Rodman Pickle, NP

## 2022-12-21 NOTE — Telephone Encounter (Signed)
I called and spoke with pharmacy tech and she ran Rx through system for the qty of 15 and with patient's insurance it will be $4.00.

## 2022-12-29 ENCOUNTER — Encounter: Payer: Self-pay | Admitting: Neurology

## 2022-12-29 ENCOUNTER — Ambulatory Visit: Payer: BC Managed Care – PPO | Admitting: Neurology

## 2022-12-29 VITALS — BP 140/88 | HR 62 | Ht 64.0 in | Wt 214.0 lb

## 2022-12-29 DIAGNOSIS — G43709 Chronic migraine without aura, not intractable, without status migrainosus: Secondary | ICD-10-CM

## 2022-12-29 DIAGNOSIS — H538 Other visual disturbances: Secondary | ICD-10-CM | POA: Diagnosis not present

## 2022-12-29 MED ORDER — TOPIRAMATE 50 MG PO TABS: 100.0000 mg | ORAL_TABLET | Freq: Every evening | ORAL | 11 refills | Status: DC

## 2022-12-29 MED ORDER — NORTRIPTYLINE HCL 10 MG PO CAPS: 20.0000 mg | ORAL_CAPSULE | Freq: Every day | ORAL | 11 refills | Status: DC

## 2022-12-29 NOTE — Progress Notes (Signed)
Chief Complaint  Patient presents with   Migraine    Rm 15 with spouse Pt is well, reports she has been having migraines for about 6 months. She has about migraines 2 migraines a week  Associated sensitivity to light, sound, blurred vision.       ASSESSMENT AND PLAN  Rachael Hahn is a 27 y.o. female    Chronic migraine headache with blurry vision, Rapid weight gain  Topamax 50 mg titrating to 100 mg every night as migraine prevention, also weight loss benefit  Refer to ophthalmology to rule out intracranial hypertension  Nurtec 75 mg as needed works well for her  Depend on the ophthalmology evaluation report, if is normal, she will continue follow-up with her primary care, if abnormal, we will proceed with MRI of the brain potential lumbar puncture for evaluation to rule out intracranial hypertension   DIAGNOSTIC DATA (LABS, IMAGING, TESTING) - I reviewed patient records, labs, notes, testing and imaging myself where available.   MEDICAL HISTORY:  Rachael Hahn is a 27 year old female, seen in request by her primary care nurse practitioner Rodman Pickle for evaluation of frequent headaches, she is accompanied by her partner at today's visit December 29, 2022   History is obtained from the patient and review of electronic medical records. I personally reviewed pertinent available imaging films in PACS.   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, at warehouse, with light, noise and smell, few months after she started a job, she began to have frequent headaches, usually happen at her work with stimulation surrounding her, retro-orbital area pressure headache, with light noise sensitivity, she just before dark quiet room resting,  She also works a second job from morning to 3 PM, does not get enough sleep, denied difficulty falling to sleep, but over the past 1 year she had rapid weight gain, besides her frequent headaches, she also  complains of blurry vision,  She was given prescription of Nurtec 75 mg every other day since early November, works well for her headaches  PHYSICAL EXAM:   Vitals:   12/29/22 0933 12/29/22 0939  BP: (!) 151/102 (!) 140/88  Pulse: 62 62  Weight: 214 lb (97.1 kg)   Height: 5\' 4"  (1.626 m)    Not recorded     Body mass index is 36.73 kg/m.  PHYSICAL EXAMNIATION:  Gen: NAD, conversant, well nourised, well groomed                     Cardiovascular: Regular rate rhythm, no peripheral edema, warm, nontender. Eyes: Conjunctivae clear without exudates or hemorrhage Neck: Supple, no carotid bruits. Pulmonary: Clear to auscultation bilaterally   NEUROLOGICAL EXAM:  MENTAL STATUS: Speech/cognition: Awake, alert, oriented to history taking and casual conversation CRANIAL NERVES: CN II: Visual fields are full to confrontation. Pupils are round equal and briskly reactive to light.  Funduscopy examination showed sharp temporal edge, mild blurry at nasal edge CN III, IV, VI: extraocular movement are normal. No ptosis. CN V: Facial sensation is intact to light touch CN VII: Face is symmetric with normal eye closure  CN VIII: Hearing is normal to causal conversation. CN IX, X: Phonation is normal. CN XI: Head turning and shoulder shrug are intact  MOTOR: There is no pronator drift of out-stretched arms. Muscle bulk and tone are normal. Muscle strength is normal.  REFLEXES: Reflexes are 2+ and symmetric at the biceps, triceps, knees, and ankles.  Plantar responses are flexor.  SENSORY: Intact to light touch, pinprick and vibratory sensation are intact in fingers and toes.  COORDINATION: There is no trunk or limb dysmetria noted.  GAIT/STANCE: Posture is normal. Gait is steady   REVIEW OF SYSTEMS:  Full 14 system review of systems performed and notable only for as above All other review of systems were negative.   ALLERGIES: Allergies  Allergen Reactions   Amoxicillin  Other (See Comments)    Patient states "yeast infection"    Penicillin G     Other Reaction(s): yeast inf    HOME MEDICATIONS: Current Outpatient Medications  Medication Sig Dispense Refill   acetaminophen (TYLENOL) 650 MG CR tablet Take 650 mg by mouth every 8 (eight) hours as needed for pain.     amLODipine (NORVASC) 5 MG tablet TAKE 1 TABLET(5 MG) BY MOUTH DAILY 90 tablet 1   aspirin-acetaminophen-caffeine (EXCEDRIN MIGRAINE) 250-250-65 MG tablet Take 1 tablet by mouth every 6 (six) hours as needed for headache or migraine. 30 tablet 0   NURTEC 75 MG TBDP Take by mouth.     Vitamin D, Ergocalciferol, (DRISDOL) 1.25 MG (50000 UNIT) CAPS capsule Take 1 capsule (50,000 Units total) by mouth every 7 (seven) days. 12 capsule 0   No current facility-administered medications for this visit.    PAST MEDICAL HISTORY: Past Medical History:  Diagnosis Date   Arthritis    Depression    Hypertension    Migraine    Trichomoniasis     PAST SURGICAL HISTORY: History reviewed. No pertinent surgical history.  FAMILY HISTORY: Family History  Problem Relation Age of Onset   Hypertension Mother        off medication   Hypertension Father    Diabetes Maternal Aunt    Hypertension Maternal Aunt     SOCIAL HISTORY: Social History   Socioeconomic History   Marital status: Significant Other    Spouse name: Not on file   Number of children: Not on file   Years of education: Not on file   Highest education level: Not on file  Occupational History   Not on file  Tobacco Use   Smoking status: Never   Smokeless tobacco: Never  Vaping Use   Vaping status: Never Used  Substance and Sexual Activity   Alcohol use: Yes    Comment: occasionally   Drug use: Not Currently   Sexual activity: Yes  Other Topics Concern   Not on file  Social History Narrative   Not on file   Social Determinants of Health   Financial Resource Strain: Not on file  Food Insecurity: Not on file   Transportation Needs: Not on file  Physical Activity: Not on file  Stress: Not on file  Social Connections: Unknown (06/18/2021)   Received from [facility], [facility]   Social Network    Social Network: Not on file  Intimate Partner Violence: Unknown (05/11/2021)   Received from [employer], [facility]   HITS    Physically Hurt: Not on file    Insult or Talk Down To: Not on file    Threaten Physical Harm: Not on file    Scream or Curse: Not on file      Levert Feinstein, M.D. Ph.D.  [facility] [address], [address] [place], [place] [address] Ph: [phone] Fax: [phone]  CC:  Gerre Scull, NP [address] [place],  [place] [address]  Gerre Scull, NP

## 2022-12-29 NOTE — Patient Instructions (Signed)
Meds ordered this encounter  Medications   topiramate (TOPAMAX) 50 MG tablet    Sig: Take 2 tablets (100 mg total) by mouth at bedtime.    Dispense:  60 tablet    Refill:  11    Cancel Nortriptyline

## 2023-01-02 ENCOUNTER — Telehealth: Payer: Self-pay | Admitting: Neurology

## 2023-01-02 NOTE — Telephone Encounter (Signed)
Referral  for ophthalmology fax to [facility]. Phone: [phone], Fax: [phone]

## 2023-01-03 ENCOUNTER — Telehealth: Payer: Self-pay | Admitting: Nurse Practitioner

## 2023-01-03 NOTE — Telephone Encounter (Signed)
Pt is wanting to try a weight loss medication. Pt declined appointment, she says this has been discussed in the past with you.   [id number] Ginette Otto, [place] - [address] AT [facility] [address] [place], [place] [address] Phone: [phone]  Fax: [phone] DEA #: [id number]  Pt at [phone]

## 2023-01-04 NOTE — Telephone Encounter (Signed)
LVM for patient to return call. 

## 2023-01-04 NOTE — Telephone Encounter (Signed)
Patient returned call and I scheduled her for an appointment for 01/11/2023.

## 2023-01-11 ENCOUNTER — Encounter: Payer: Self-pay | Admitting: Nurse Practitioner

## 2023-01-11 ENCOUNTER — Telehealth: Payer: Self-pay

## 2023-01-11 ENCOUNTER — Ambulatory Visit: Payer: BC Managed Care – PPO | Admitting: Nurse Practitioner

## 2023-01-11 ENCOUNTER — Other Ambulatory Visit (HOSPITAL_COMMUNITY): Payer: Self-pay

## 2023-01-11 VITALS — BP 122/80 | HR 56 | Temp 97.0°F | Ht 64.0 in | Wt 212.0 lb

## 2023-01-11 DIAGNOSIS — G43709 Chronic migraine without aura, not intractable, without status migrainosus: Secondary | ICD-10-CM

## 2023-01-11 DIAGNOSIS — E669 Obesity, unspecified: Secondary | ICD-10-CM | POA: Diagnosis not present

## 2023-01-11 MED ORDER — ZEPBOUND 2.5 MG/0.5ML ~~LOC~~ SOAJ: 2.5000 mg | SUBCUTANEOUS | 1 refills | Status: DC

## 2023-01-11 NOTE — Progress Notes (Signed)
Established Patient Office Visit  Subjective   Patient ID: Rachael Hahn, female    DOB: [birth year]  Age: 27 y.o. MRN: [id number]  Chief Complaint  Patient presents with   Weight Management    Discuss taking the ozempic    HPI  Discussed the use of AI scribe software for clinical note transcription with the patient, who gave verbal consent to proceed.  History of Present Illness   The patient, with a history of migraines and obesity, presents to discuss weight loss medications. She has not been actively trying to lose weight, but recently started Topamax for migraines and noticed a decrease in appetite and a 2-pound weight loss. She reports that the Topamax has been 'amazing' for controlling her migraines and she plans to continue taking it. She is interested in additional weight loss medications, specifically Wegovy or mounjaro, but is concerned about potential health issues and whether her insurance will cover the cost. She also mentions that she works two jobs and has difficulty getting enough sleep.        ROS See pertinent positives and negatives per HPI.    Objective:     BP 122/80 (BP Location: Left Arm)   Pulse (!) 56   Temp (!) 97 F (36.1 C)   Ht 5\' 4"  (1.626 m)   Wt 212 lb (96.2 kg)   LMP 01/08/2023 (Exact Date)   BMI 36.39 kg/m  BP Readings from Last 3 Encounters:  01/11/23 122/80  12/29/22 (!) 140/88  12/08/22 119/78   Wt Readings from Last 3 Encounters:  01/11/23 212 lb (96.2 kg)  12/29/22 214 lb (97.1 kg)  11/02/22 204 lb (92.5 kg)      Physical Exam Vitals and nursing note reviewed.  Constitutional:      General: She is not in acute distress.    Appearance: Normal appearance.  HENT:     Head: Normocephalic.  Eyes:     Conjunctiva/sclera: Conjunctivae normal.  Cardiovascular:     Rate and Rhythm: Normal rate and regular rhythm.     Pulses: Normal pulses.     Heart sounds: Normal heart sounds.  Pulmonary:     Effort: Pulmonary effort  is normal.     Breath sounds: Normal breath sounds.  Musculoskeletal:     Cervical back: Normal range of motion.  Skin:    General: Skin is warm.  Neurological:     General: No focal deficit present.     Mental Status: She is alert and oriented to person, place, and time.  Psychiatric:        Mood and Affect: Mood normal.        Behavior: Behavior normal.        Thought Content: Thought content normal.        Judgment: Judgment normal.      Assessment & Plan:   Problem List Items Addressed This Visit       Cardiovascular and Mediastinum   Chronic migraine w/o aura w/o status migrainosus, not intractable - Primary    Her migraines have improved with Topamax without adverse effects. We will continue Topamax 100mg  at bedtime as prescribed by neurology.        Other   Obesity (BMI 30-39.9)    She expressed interest in weight loss medications and has lost 2 pounds since starting Topamax. We discussed options including Wegovy, Zepbound, and Contrave. We will start zepbound 2.5mg  injection weekly pending insurance approval. Discussed out of pocket costs for medications if not. Continue  to focus on healthy eating options and smaller portion sizes along with exercise. Follow-up in 6-8 weeks.       Relevant Medications   tirzepatide (ZEPBOUND) 2.5 MG/0.5ML Pen    Return in about 6 weeks (around 02/22/2023) for weight management .    Gerre Scull, NP

## 2023-01-11 NOTE — Assessment & Plan Note (Signed)
She expressed interest in weight loss medications and has lost 2 pounds since starting Topamax. We discussed options including Wegovy, Zepbound, and Contrave. We will start zepbound 2.5mg  injection weekly pending insurance approval. Discussed out of pocket costs for medications if not. Continue to focus on healthy eating options and smaller portion sizes along with exercise. Follow-up in 6-8 weeks.

## 2023-01-11 NOTE — Assessment & Plan Note (Signed)
Her migraines have improved with Topamax without adverse effects. We will continue Topamax 100mg  at bedtime as prescribed by neurology.

## 2023-01-11 NOTE — Patient Instructions (Signed)
It was great to see you!  Call your insurance and see if they cover:  Contrave - $99 without insurance Wegovy  Zepbound  Let's follow-up in 6-8 weeks, sooner if you have concerns.  If a referral was placed today, you will be contacted for an appointment. Please note that routine referrals can sometimes take up to 3-4 weeks to process. Please call our office if you haven't heard anything after this time frame.  Take care,  Rodman Pickle, NP

## 2023-01-11 NOTE — Telephone Encounter (Signed)
Pharmacy Patient Advocate Encounter   Received notification from Pt Calls Messages that prior authorization for Zepbound 2.5mg /0.82ml is required/requested.   Insurance verification completed.   The patient is insured through [employer] .   Per test claim: PA required; PA submitted to above mentioned insurance via Fax Key/confirmation #/EOC -- Status is pending   Fax# [phone] Phone# [phone]

## 2023-01-11 NOTE — Telephone Encounter (Signed)
Can we get a prior auth on Zepbound 2.5mg ?

## 2023-01-12 ENCOUNTER — Other Ambulatory Visit (HOSPITAL_COMMUNITY): Payer: Self-pay

## 2023-01-12 NOTE — Telephone Encounter (Signed)
I called and spoke with patient and notified her that Zepbound was approved and to call her pharmacy to make sure it is in stock. The patient had no further questions or concerns.

## 2023-01-12 NOTE — Telephone Encounter (Signed)
Pharmacy Patient Advocate Encounter  Received notification from [facility] that Prior Authorization for ZEPBOUND has been APPROVED from 12/13/22 to 01/12/24. Ran test claim, Copay is $49.03. This test claim was processed through [facility]- copay amounts may vary at other pharmacies due to pharmacy/plan contracts, or as the patient moves through the different stages of their insurance plan.   PA #/Case ID/Reference #: [id number]

## 2023-01-29 ENCOUNTER — Other Ambulatory Visit: Payer: Self-pay | Admitting: Nurse Practitioner

## 2023-01-30 NOTE — Telephone Encounter (Signed)
Requesting: VITAMIN D2 50,000IU (ERGO) CAP RX  Last Visit: 01/11/2023 Next Visit: Visit date not found Last Refill: 09/21/2022  Please Advise   From Labs on 09/21/2022 Your vitamin D is low. I am going to send in a supplement to take once a week for 12 weeks, then start 2,000units daily over the counter.

## 2023-02-23 ENCOUNTER — Other Ambulatory Visit (HOSPITAL_COMMUNITY)
Admission: RE | Admit: 2023-02-23 | Discharge: 2023-02-23 | Disposition: A | Discharge status: Home / Self Care | Payer: BC Managed Care – PPO | Source: Ambulatory Visit | Attending: Nurse Practitioner | Admitting: Nurse Practitioner

## 2023-02-23 ENCOUNTER — Ambulatory Visit: Payer: BC Managed Care – PPO | Admitting: Nurse Practitioner

## 2023-02-23 ENCOUNTER — Encounter: Payer: Self-pay | Admitting: Nurse Practitioner

## 2023-02-23 VITALS — BP 106/80 | HR 60 | Temp 97.7°F | Ht 64.0 in | Wt 207.2 lb

## 2023-02-23 DIAGNOSIS — G43709 Chronic migraine without aura, not intractable, without status migrainosus: Secondary | ICD-10-CM

## 2023-02-23 DIAGNOSIS — N898 Other specified noninflammatory disorders of vagina: Secondary | ICD-10-CM | POA: Diagnosis not present

## 2023-02-23 DIAGNOSIS — Z124 Encounter for screening for malignant neoplasm of cervix: Secondary | ICD-10-CM | POA: Insufficient documentation

## 2023-02-23 DIAGNOSIS — E669 Obesity, unspecified: Secondary | ICD-10-CM | POA: Diagnosis not present

## 2023-02-23 MED ORDER — CLOBETASOL PROPIONATE 0.05 % EX CREA: 1.0000 | TOPICAL_CREAM | Freq: Two times a day (BID) | CUTANEOUS | 0 refills | Status: AC

## 2023-02-23 NOTE — Assessment & Plan Note (Signed)
BMI 35.6. She has lost 5 pounds since her last visit. She did not start the zepbound and has noted decreased appetite with topamax for migraines. Continue focus on nutrition and exercise.

## 2023-02-23 NOTE — Patient Instructions (Signed)
It was great to see you!  Start clobetasol cream twice a day to the irriation   Let's follow-up in 2 months, sooner if you have concerns.  If a referral was placed today, you will be contacted for an appointment. Please note that routine referrals can sometimes take up to 3-4 weeks to process. Please call our office if you haven't heard anything after this time frame.  Take care,  Rodman Pickle, NP

## 2023-02-23 NOTE — Assessment & Plan Note (Signed)
The irritation is likely due to a combination of detergent irritation and shaving, with no discharge or pain, only itching and tingling. Temporary relief is achieved with witch hazel. Apply Clotrimazole cream twice daily to the irritated area and change razor blades frequently to prevent further irritation.

## 2023-02-23 NOTE — Progress Notes (Signed)
Established Patient Office Visit  Subjective   Patient ID: Rachael Hahn, female    DOB: [birth year]  Age: 28 y.o. MRN: [id number]  Chief Complaint  Patient presents with   Weight Management    Follow, concerns with outside vaginal irritation and has bumps    HPI  Discussed the use of AI scribe software for clinical note transcription with the patient, who gave verbal consent to proceed.  History of Present Illness   The patient, with a history of migraines, presents with new onset vaginal bumps and irritation. The patient reports a recent weight loss of five pounds, which she attributes to her migraine medication reducing her appetite. The patient's migraines have improved significantly with her current medication regimen, and she has not started any new medications.  The patient's vaginal irritation began after a change in laundry detergent and shaving habits. The patient reports using Tide pods to wash her clothes, which initially caused whole body irritation. The irritation worsened after shaving. The patient describes the irritation as itching and tingling, with no associated pain or discharge. The patient has been using witch hazel for temporary relief.        ROS See pertinent positives and negatives per HPI.    Objective:     BP 106/80 (BP Location: Left Arm, Patient Position: Sitting, Cuff Size: Normal)   Pulse 60   Temp 97.7 F (36.5 C)   Ht 5\' 4"  (1.626 m)   Wt 207 lb 3.2 oz (94 kg)   LMP 01/30/2023 (Exact Date)   SpO2 98%   BMI 35.57 kg/m  BP Readings from Last 3 Encounters:  02/23/23 106/80  01/11/23 122/80  12/29/22 (!) 140/88   Wt Readings from Last 3 Encounters:  02/23/23 207 lb 3.2 oz (94 kg)  01/11/23 212 lb (96.2 kg)  12/29/22 214 lb (97.1 kg)      Physical Exam Vitals and nursing note reviewed.  Constitutional:      General: She is not in acute distress.    Appearance: Normal appearance.  HENT:     Head: Normocephalic.  Eyes:      Conjunctiva/sclera: Conjunctivae normal.  Cardiovascular:     Rate and Rhythm: Normal rate and regular rhythm.     Pulses: Normal pulses.     Heart sounds: Normal heart sounds.  Pulmonary:     Effort: Pulmonary effort is normal.     Breath sounds: Normal breath sounds.  Genitourinary:    General: Normal vulva.     Exam position: Lithotomy position.     Pubic Area: Rash (red bumps noted to pubic area) present.     Labia:        Right: No rash, tenderness or lesion.        Left: No rash, tenderness or lesion.      Vagina: Normal.     Cervix: Normal.     Uterus: Normal.      Adnexa: Right adnexa normal and left adnexa normal.  Musculoskeletal:     Cervical back: Normal range of motion.  Skin:    General: Skin is warm.  Neurological:     General: No focal deficit present.     Mental Status: She is alert and oriented to person, place, and time.  Psychiatric:        Mood and Affect: Mood normal.        Behavior: Behavior normal.        Thought Content: Thought content normal.  Judgment: Judgment normal.       Assessment & Plan:   Problem List Items Addressed This Visit       Cardiovascular and Mediastinum   Chronic migraine w/o aura w/o status migrainosus, not intractable   The migraines are well controlled with the current medication regimen. Weight loss is noted, possibly due to decreased appetite from the medication. Continue Topamax 50mg  daily.         Other   Obesity (BMI 30-39.9)   BMI 35.6. She has lost 5 pounds since her last visit. She did not start the zepbound and has noted decreased appetite with topamax for migraines. Continue focus on nutrition and exercise.       Vaginal irritation - Primary   The irritation is likely due to a combination of detergent irritation and shaving, with no discharge or pain, only itching and tingling. Temporary relief is achieved with witch hazel. Apply Clotrimazole cream twice daily to the irritated area and change razor  blades frequently to prevent further irritation.       Other Visit Diagnoses       Screening for cervical cancer       Pap done today   Relevant Orders   Cytology - PAP       Return in about 2 months (around 04/23/2023) for CPE.    Gerre Scull, NP

## 2023-02-23 NOTE — Assessment & Plan Note (Signed)
The migraines are well controlled with the current medication regimen. Weight loss is noted, possibly due to decreased appetite from the medication. Continue Topamax 50mg  daily.

## 2023-02-27 ENCOUNTER — Encounter: Payer: Self-pay | Admitting: Nurse Practitioner

## 2023-02-27 LAB — CYTOLOGY - PAP: Diagnosis: NEGATIVE

## 2023-03-20 ENCOUNTER — Ambulatory Visit
Admission: RE | Admit: 2023-03-20 | Discharge: 2023-03-20 | Disposition: A | Discharge status: Home / Self Care | Payer: BC Managed Care – PPO | Source: Ambulatory Visit | Attending: Urgent Care | Admitting: Urgent Care

## 2023-03-20 VITALS — BP 126/83 | HR 68 | Temp 98.5°F | Resp 16

## 2023-03-20 DIAGNOSIS — M25562 Pain in left knee: Secondary | ICD-10-CM

## 2023-03-20 MED ORDER — NAPROXEN 500 MG PO TABS: 500.0000 mg | ORAL_TABLET | Freq: Two times a day (BID) | ORAL | 0 refills | Status: DC

## 2023-03-20 NOTE — Discharge Instructions (Signed)
 I have placed orders to have an x-ray done at the med center in [facility].  Please had there now.  Go through the main hospital and not the emergency room.  Once you are there and let them know that you will came to our clinic and we send she to their facility for an outpatient x-ray.  If no one is at the front desk then they are likely out the rest of the day and at that point you would have to go through the emergency room.  Do not check in as a patient through the emergency room.  Simply let them know that you are there for an outpatient x-ray from our clinic.  I will call you with your results and update our treatment plan if necessary after I get the report.  Please wait to go pick up your prescriptions for any medications I have prescribed for you until after we discussed your x-ray results.   Use naproxen  for pain and inflammation. Use Ace wrap during the day. Do not use it at night. When you are off, ice for 20 minutes then stop for 2 hours.
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
## 2023-03-23 ENCOUNTER — Ambulatory Visit
Admission: RE | Admit: 2023-03-23 | Discharge: 2023-03-23 | Disposition: A | Discharge status: Home / Self Care | Payer: BC Managed Care – PPO | Source: Ambulatory Visit | Attending: Family Medicine | Admitting: Family Medicine

## 2023-03-23 ENCOUNTER — Ambulatory Visit (INDEPENDENT_AMBULATORY_CARE_PROVIDER_SITE_OTHER): Payer: BC Managed Care – PPO

## 2023-03-23 VITALS — BP 141/91 | HR 64 | Resp 17

## 2023-03-23 DIAGNOSIS — S8991XA Unspecified injury of right lower leg, initial encounter: Secondary | ICD-10-CM | POA: Diagnosis not present

## 2023-03-23 DIAGNOSIS — M25561 Pain in right knee: Secondary | ICD-10-CM

## 2023-03-23 DIAGNOSIS — S8001XA Contusion of right knee, initial encounter: Secondary | ICD-10-CM | POA: Diagnosis not present

## 2023-03-23 NOTE — Discharge Instructions (Signed)
Please use the sleeve on your knee to help support the knee as well as help with swelling.  You may elevate and ice as needed.  Take over-the-counter Tylenol or ibuprofen as needed.  Please follow-up with her Worker's Comp. clinic for further evaluation/recheck as this did occur at work.  Please go to the ER if you develop any worsening symptoms.  Hope you feel better soon!
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
## 2023-03-23 NOTE — ED Triage Notes (Signed)
Pt presents with c/o rt knee pain x 2 days. Pt states she fell at work yesterday. States she was seen for lt knee pain recently and states that knee feels better. C/o hearing a popping sound from the rt knee when she walks

## 2023-04-13 ENCOUNTER — Other Ambulatory Visit: Payer: Self-pay | Admitting: Nurse Practitioner

## 2023-04-13 DIAGNOSIS — I10 Essential (primary) hypertension: Secondary | ICD-10-CM

## 2023-04-13 MED ORDER — AMLODIPINE BESYLATE 5 MG PO TABS: 5.0000 mg | ORAL_TABLET | Freq: Every day | ORAL | 0 refills | Status: DC

## 2023-04-13 NOTE — Telephone Encounter (Signed)
 Copied from CRM [id number]. Topic: Clinical - Medication Refill >> Apr 13, 2023  8:36 AM Elizebeth Brooking wrote: Most Recent Primary Care Visit:  Provider: Rodman Pickle A  Department: [facility]  Visit Type: OFFICE VISIT  Date: 02/23/2023  Medication: amLODipine (NORVASC) 5 MG tablet  Has the patient contacted their pharmacy? Yes (Agent: If no, request that the patient contact the pharmacy for the refill. If patient does not wish to contact the pharmacy document the reason why and proceed with request.) (Agent: If yes, when and what did the pharmacy advise?)  Is this the correct pharmacy for this prescription? Yes If no, delete pharmacy and type the correct one.  This is the patient's preferred pharmacy:  [id number] [place], [place] - [address] AT [facility] OF [facility] [address] [place] [address] Phone: [phone] Fax: [phone]   Has the prescription been filled recently? No  Is the patient out of the medication? Yes  Has the patient been seen for an appointment in the last year OR does the patient have an upcoming appointment? Yes  Can we respond through MyChart? Yes  Agent: Please be advised that Rx refills may take up to 3 business days. We ask that you follow-up with your pharmacy.

## 2023-04-13 NOTE — Telephone Encounter (Signed)
 Requesting: amLODipine (NORVASC) 5 MG tablet  Last Visit: 02/23/2023 Next Visit: 05/05/2023 Last Refill: 11/29/2022  Please Advise

## 2023-05-05 ENCOUNTER — Ambulatory Visit (INDEPENDENT_AMBULATORY_CARE_PROVIDER_SITE_OTHER): Payer: BC Managed Care – PPO | Admitting: Nurse Practitioner

## 2023-05-05 ENCOUNTER — Encounter: Payer: Self-pay | Admitting: Nurse Practitioner

## 2023-05-05 VITALS — BP 124/78 | HR 60 | Temp 96.6°F | Ht 64.0 in | Wt 211.2 lb

## 2023-05-05 DIAGNOSIS — G43709 Chronic migraine without aura, not intractable, without status migrainosus: Secondary | ICD-10-CM

## 2023-05-05 DIAGNOSIS — R202 Paresthesia of skin: Secondary | ICD-10-CM | POA: Insufficient documentation

## 2023-05-05 DIAGNOSIS — Z0000 Encounter for general adult medical examination without abnormal findings: Secondary | ICD-10-CM | POA: Diagnosis not present

## 2023-05-05 DIAGNOSIS — E669 Obesity, unspecified: Secondary | ICD-10-CM

## 2023-05-05 DIAGNOSIS — E559 Vitamin D deficiency, unspecified: Secondary | ICD-10-CM | POA: Diagnosis not present

## 2023-05-05 DIAGNOSIS — R200 Anesthesia of skin: Secondary | ICD-10-CM

## 2023-05-05 DIAGNOSIS — I10 Essential (primary) hypertension: Secondary | ICD-10-CM

## 2023-05-05 DIAGNOSIS — L853 Xerosis cutis: Secondary | ICD-10-CM

## 2023-05-05 DIAGNOSIS — M25569 Pain in unspecified knee: Secondary | ICD-10-CM

## 2023-05-05 LAB — CBC WITH DIFFERENTIAL/PLATELET
Basophils Absolute: 0 10*3/uL (ref 0.0–0.1)
Basophils Relative: 0.4 % (ref 0.0–3.0)
Eosinophils Absolute: 0.1 10*3/uL (ref 0.0–0.7)
Eosinophils Relative: 1.3 % (ref 0.0–5.0)
HCT: 38.3 % (ref 36.0–46.0)
Hemoglobin: 12.8 g/dL (ref 12.0–15.0)
Lymphocytes Relative: 48.8 % — ABNORMAL HIGH (ref 12.0–46.0)
Lymphs Abs: 2.2 10*3/uL (ref 0.7–4.0)
MCHC: 33.4 g/dL (ref 30.0–36.0)
MCV: 88.5 fl (ref 78.0–100.0)
Monocytes Absolute: 0.3 10*3/uL (ref 0.1–1.0)
Monocytes Relative: 6.8 % (ref 3.0–12.0)
Neutro Abs: 1.9 10*3/uL (ref 1.4–7.7)
Neutrophils Relative %: 42.7 % — ABNORMAL LOW (ref 43.0–77.0)
Platelets: 325 10*3/uL (ref 150.0–400.0)
RBC: 4.33 Mil/uL (ref 3.87–5.11)
RDW: 13.8 % (ref 11.5–15.5)
WBC: 4.4 10*3/uL (ref 4.0–10.5)

## 2023-05-05 LAB — COMPREHENSIVE METABOLIC PANEL WITH GFR
ALT: 14 U/L (ref 0–35)
AST: 13 U/L (ref 0–37)
Albumin: 4.2 g/dL (ref 3.5–5.2)
Alkaline Phosphatase: 75 U/L (ref 39–117)
BUN: 11 mg/dL (ref 6–23)
CO2: 22 meq/L (ref 19–32)
Calcium: 9.3 mg/dL (ref 8.4–10.5)
Chloride: 108 meq/L (ref 96–112)
Creatinine, Ser: 0.66 mg/dL (ref 0.40–1.20)
GFR: 121.99 mL/min (ref >60.00)
Glucose, Bld: 63 mg/dL — ABNORMAL LOW (ref 70–99)
Potassium: 3.7 meq/L (ref 3.5–5.1)
Sodium: 139 meq/L (ref 135–145)
Total Bilirubin: 0.4 mg/dL (ref 0.2–1.2)
Total Protein: 7 g/dL (ref 6.0–8.3)

## 2023-05-05 LAB — VITAMIN D 25 HYDROXY (VIT D DEFICIENCY, FRACTURES): VITD: 15.17 ng/mL — ABNORMAL LOW (ref 30.00–100.00)

## 2023-05-05 LAB — TSH: TSH: 1.36 u[IU]/mL (ref 0.35–5.50)

## 2023-05-05 LAB — VITAMIN B12: Vitamin B-12: 422 pg/mL (ref 211–911)

## 2023-05-05 MED ORDER — NAPROXEN 500 MG PO TABS: 500.0000 mg | ORAL_TABLET | Freq: Two times a day (BID) | ORAL | 0 refills | Status: DC

## 2023-05-05 NOTE — Progress Notes (Signed)
 BP 124/78 (BP Location: Left Arm, Cuff Size: Large)   Pulse 60   Temp (!) 96.6 F (35.9 C)   Ht 5\' 4"  (1.626 m)   Wt 211 lb 3.2 oz (95.8 kg)   LMP 04/27/2023 (Exact Date)   SpO2 98%   BMI 36.25 kg/m    Subjective:    Patient ID: Rachael Hahn, female    DOB: Nov 29, 1995, 28 y.o.   MRN: [id number]  CC: Chief Complaint  Patient presents with   Annual Exam    With lab work-patient is not fasting, concerns with numbness and tingling in finger tips and swelling in hands and acne and boils on back    HPI: Rachael Hahn is a 28 y.o. female presenting on 05/05/2023 for comprehensive medical examination. Current medical complaints include: numbness and tingling in fingers, acne on back  Discussed the use of AI scribe software for clinical note transcription with the patient, who gave verbal consent to proceed.  History of Present Illness   The patient, with a history of hypertension, migraines and currently on Topamax, presents with new onset tingling in the fingertips that started yesterday. She denies any associated neck pain and there have been no recent changes in diet or medication. The patient also reports a long-standing issue of acne and boils on her back, which she has not previously mentioned due to forgetfulness. The acne and boils are not currently flaring up and do not occur often, but when they do, they cause significant irritation. The patient also mentions a recent fall at work which resulted in knee pain. The pain comes and goes, and is particularly noticeable when the patient is on her feet. She was prescribed naproxen for the pain but has not yet collected the prescription.      She currently lives with: significant other Menopausal Symptoms: no  Depression and Anxiety Screen done today and results listed below:     05/05/2023    8:49 AM 06/08/2022    9:03 AM 03/19/2021    1:45 PM  Depression screen PHQ 2/9  Decreased Interest 0 0 0  Down, Depressed, Hopeless  0 0 0  PHQ - 2 Score 0 0 0  Altered sleeping 0 0   Tired, decreased energy 0 2   Change in appetite 0 2   Feeling bad or failure about yourself  0 0   Trouble concentrating 0 1   Moving slowly or fidgety/restless 0 0   Suicidal thoughts 0 0   PHQ-9 Score 0 5   Difficult doing work/chores Not difficult at all Not difficult at all       05/05/2023    8:49 AM 06/08/2022    9:04 AM 03/19/2021    1:45 PM  GAD 7 : Generalized Anxiety Score  Nervous, Anxious, on Edge 0 0 0  Control/stop worrying 0 0 0  Worry too much - different things 0 3 0  Trouble relaxing 0 0 0  Restless 0 0 0  Easily annoyed or irritable 0 0 0  Afraid - awful might happen 0 0 0  Total GAD 7 Score 0 3 0  Anxiety Difficulty Not difficult at all Not difficult at all     The patient has a history of falls. I did complete a risk assessment for falls. A plan of care for falls was documented.   Past Medical History:  Past Medical History:  Diagnosis Date   Arthritis    Depression    Hypertension  Migraine    Trichomoniasis     Surgical History:  History reviewed. No pertinent surgical history.  Medications:  Current Outpatient Medications on File Prior to Visit  Medication Sig   acetaminophen (TYLENOL) 650 MG CR tablet Take 650 mg by mouth every 8 (eight) hours as needed for pain.   aspirin-acetaminophen-caffeine (EXCEDRIN MIGRAINE) 250-250-65 MG tablet Take 1 tablet by mouth every 6 (six) hours as needed for headache or migraine.   clobetasol cream (TEMOVATE) 0.05 % Apply 1 Application topically 2 (two) times daily. (Patient not taking: Reported on 05/05/2023)   NURTEC 75 MG TBDP Take by mouth. (Patient not taking: Reported on 01/11/2023)   topiramate (TOPAMAX) 50 MG tablet Take 2 tablets (100 mg total) by mouth at bedtime.   No current facility-administered medications on file prior to visit.    Allergies:  Allergies  Allergen Reactions   Amoxicillin Other (See Comments)    Patient states "yeast  infection"    Penicillin G     Other Reaction(s): yeast inf    Social History:  Social History   Socioeconomic History   Marital status: Significant Other    Spouse name: Not on file   Number of children: Not on file   Years of education: Not on file   Highest education level: Not on file  Occupational History   Not on file  Tobacco Use   Smoking status: Never   Smokeless tobacco: Never  Vaping Use   Vaping status: Never Used  Substance and Sexual Activity   Alcohol use: Yes    Comment: occasionally   Drug use: Not Currently   Sexual activity: Not on file  Other Topics Concern   Not on file  Social History Narrative   Not on file   Social Drivers of Health   Financial Resource Strain: Not on file  Food Insecurity: Not on file  Transportation Needs: Not on file  Physical Activity: Not on file  Stress: Not on file  Social Connections: Unknown (06/18/2021)   [facility] from [facility], [facility]   Social Network    Social Network: Not on file  Intimate Partner Violence: Unknown (05/11/2021)   Received from [employer], [facility]   HITS    Physically Hurt: Not on file    Insult or Talk Down To: Not on file    Threaten Physical Harm: Not on file    Scream or Curse: Not on file   Social History   Tobacco Use  Smoking Status Never  Smokeless Tobacco Never   Social History   Substance and Sexual Activity  Alcohol Use Yes   Comment: occasionally    Family History:  Family History  Problem Relation Age of Onset   Hypertension Mother        off medication   Hypertension Father    Diabetes Maternal Aunt    Hypertension Maternal Aunt     Past medical history, surgical history, medications, allergies, family history and social history reviewed with patient today and changes made to appropriate areas of the chart.   Review of Systems  Constitutional: Negative.   HENT: Negative.    Eyes: Negative.   Respiratory: Negative.    Cardiovascular:  Negative.   Gastrointestinal: Negative.   Genitourinary: Negative.   Musculoskeletal:  Positive for joint pain (knee).  Skin:        Acne and boils on back   Neurological:  Positive for tingling (fingers).  Psychiatric/Behavioral: Negative.     All other ROS  negative except what is listed above and in the HPI.      Objective:    BP 124/78 (BP Location: Left Arm, Cuff Size: Large)   Pulse 60   Temp (!) 96.6 F (35.9 C)   Ht 5\' 4"  (1.626 m)   Wt 211 lb 3.2 oz (95.8 kg)   LMP 04/27/2023 (Exact Date)   SpO2 98%   BMI 36.25 kg/m   Wt Readings from Last 3 Encounters:  05/05/23 211 lb 3.2 oz (95.8 kg)  02/23/23 207 lb 3.2 oz (94 kg)  01/11/23 212 lb (96.2 kg)    Physical Exam Vitals and nursing note reviewed.  Constitutional:      General: She is not in acute distress.    Appearance: Normal appearance.  HENT:     Head: Normocephalic and atraumatic.     Right Ear: Tympanic membrane, ear canal and external ear normal.     Left Ear: Tympanic membrane, ear canal and external ear normal.  Eyes:     Conjunctiva/sclera: Conjunctivae normal.  Cardiovascular:     Rate and Rhythm: Normal rate and regular rhythm.     Pulses: Normal pulses.     Heart sounds: Normal heart sounds.  Pulmonary:     Effort: Pulmonary effort is normal.     Breath sounds: Normal breath sounds.  Abdominal:     Palpations: Abdomen is soft.     Tenderness: There is no abdominal tenderness.  Musculoskeletal:        General: Normal range of motion.     Cervical back: Normal range of motion and neck supple.     Right lower leg: No edema.     Left lower leg: No edema.  Lymphadenopathy:     Cervical: No cervical adenopathy.  Skin:    General: Skin is warm and dry.     Comments: Skin dry to back, no current infections  Neurological:     General: No focal deficit present.     Mental Status: She is alert and oriented to person, place, and time.     Cranial Nerves: No cranial nerve deficit.      Coordination: Coordination normal.     Gait: Gait normal.  Psychiatric:        Mood and Affect: Mood normal.        Behavior: Behavior normal.        Thought Content: Thought content normal.        Judgment: Judgment normal.     Results for orders placed or performed in visit on 02/23/23  Cytology - PAP   Collection Time: 02/23/23  8:44 AM  Result Value Ref Range   Adequacy      Satisfactory for evaluation; transformation zone component PRESENT.   Diagnosis      - Negative for intraepithelial lesion or malignancy (NILM)      Assessment & Plan:   Problem List Items Addressed This Visit       Cardiovascular and Mediastinum   Primary hypertension   Chronic, stable.  Well controlled without medication. Check CMP, CBC today.      Relevant Orders   CBC with Differential/Platelet   Comprehensive metabolic panel with GFR   TSH   Chronic migraine w/o aura w/o status migrainosus, not intractable   The migraines are well controlled with the current medication regimen.  Continue Topamax 50mg  daily.       Relevant Medications   naproxen (NAPROSYN) 500 MG tablet     Musculoskeletal and Integument  Dry skin   Chronic acne and boils are present with dry, sensitive skin, and a history of infections requiring urgent care. Recommend using chlorhexidine soap once weekly on the back, advise using sensitive skin lotion daily, and follow-up with any flare-ups of symptoms.         Other   Vitamin D deficiency   Will check vitamin D levels today and treat based on results      Relevant Orders   VITAMIN D 25 Hydroxy (Vit-D Deficiency, Fractures)   Obesity (BMI 30-39.9)   BMI 36.25 Continue focus on nutrition and exercise.       Routine general medical examination at a health care facility - Primary   Health maintenance reviewed and updated. Discussed nutrition, exercise. Follow-up 1 year.        Numbness and tingling   Intermittent fingertip tingling may be due to vitamin B12  deficiency or Topamax side effects. There is no neck pain, but slightly elevated blood pressure is noted. Check vitamin B12 levels and A1c.      Relevant Orders   CBC with Differential/Platelet   Comprehensive metabolic panel with GFR   TSH   Hemoglobin A1c   Vitamin B12   Other Visit Diagnoses       Acute knee pain, unspecified laterality       She experiences intermittent knee pain post-fall, worsened by activity. Naproxen was not obtained. Refill naproxen prescription.        Follow up plan: Return in about 1 year (around 05/04/2024) for CPE.   LABORATORY TESTING:  - Pap smear: up to date  IMMUNIZATIONS:   - Tdap: Tetanus vaccination status reviewed: last tetanus booster within 10 years. - Influenza: Declined - Pneumovax: Not applicable - Prevnar: Not applicable - HPV: Up to date - Shingrix vaccine: Not applicable  SCREENING: -Mammogram: Not applicable  - Colonoscopy: Not applicable  - Bone Density: Not applicable   PATIENT COUNSELING:   Advised to take 1 mg of folate supplement per day if capable of pregnancy.   Sexuality: Discussed sexually transmitted diseases, partner selection, use of condoms, avoidance of unintended pregnancy  and contraceptive alternatives.   Advised to avoid cigarette smoking.  I discussed with the patient that most people either abstain from alcohol or drink within safe limits (<=14/week and <=4 drinks/occasion for males, <=7/weeks and <= 3 drinks/occasion for females) and that the risk for alcohol disorders and other health effects rises proportionally with the number of drinks per week and how often a drinker exceeds daily limits.  Discussed cessation/primary prevention of drug use and availability of treatment for abuse.   Diet: Encouraged to adjust caloric intake to maintain  or achieve ideal body weight, to reduce intake of dietary saturated fat and total fat, to limit sodium intake by avoiding high sodium foods and not adding table  salt, and to maintain adequate dietary potassium and calcium preferably from fresh fruits, vegetables, and low-fat dairy products.    stressed the importance of regular exercise  Injury prevention: Discussed safety belts, safety helmets, smoke detector, smoking near bedding or upholstery.   Dental health: Discussed importance of regular tooth brushing, flossing, and dental visits.    NEXT PREVENTATIVE PHYSICAL DUE IN 1 YEAR. Return in about 1 year (around 05/04/2024) for CPE.  Rishawn Walck A Nitish Roes

## 2023-05-05 NOTE — Assessment & Plan Note (Signed)
Chronic, stable. Well controlled without medication. Check CMP, CBC today.

## 2023-05-05 NOTE — Assessment & Plan Note (Signed)
 Chronic acne and boils are present with dry, sensitive skin, and a history of infections requiring urgent care. Recommend using chlorhexidine soap once weekly on the back, advise using sensitive skin lotion daily, and follow-up with any flare-ups of symptoms.

## 2023-05-05 NOTE — Assessment & Plan Note (Signed)
 BMI 36.25 Continue focus on nutrition and exercise.

## 2023-05-05 NOTE — Assessment & Plan Note (Signed)
 Intermittent fingertip tingling may be due to vitamin B12 deficiency or Topamax side effects. There is no neck pain, but slightly elevated blood pressure is noted. Check vitamin B12 levels and A1c.

## 2023-05-05 NOTE — Assessment & Plan Note (Signed)
Will check vitamin D levels today and treat based on results.

## 2023-05-05 NOTE — Assessment & Plan Note (Addendum)
 The migraines are well controlled with the current medication regimen.  Continue Topamax 50mg  daily.

## 2023-05-05 NOTE — Assessment & Plan Note (Signed)
Health maintenance reviewed and updated. Discussed nutrition, exercise. Follow-up 1 year.

## 2023-05-05 NOTE — Patient Instructions (Signed)
 It was great to see you!  We are checking your labs today and will let you know the results via mychart/phone.   Start using CHG (chlorhexadine) soap once a week, don't use this on your face or genitals   Start using lotion once a day on your back (like Dove sensitive skin)  Let's follow-up in 1 year, sooner if you have concerns.  If a referral was placed today, you will be contacted for an appointment. Please note that routine referrals can sometimes take up to 3-4 weeks to process. Please call our office if you haven't heard anything after this time frame.  Take care,  Rodman Pickle, NP

## 2023-05-06 LAB — HEMOGLOBIN A1C: Hgb A1c MFr Bld: 5.3 % (ref 4.6–6.5)

## 2023-05-08 ENCOUNTER — Encounter: Payer: Self-pay | Admitting: Nurse Practitioner

## 2023-05-08 MED ORDER — VITAMIN D (ERGOCALCIFEROL) 1.25 MG (50000 UNIT) PO CAPS: 50000.0000 [IU] | ORAL_CAPSULE | ORAL | 0 refills | Status: DC

## 2023-05-08 NOTE — Addendum Note (Signed)
 Addended by: Rodman Pickle A on: 05/08/2023 04:04 PM   Modules accepted: Orders

## 2023-05-20 ENCOUNTER — Other Ambulatory Visit: Payer: Self-pay | Admitting: Nurse Practitioner

## 2023-05-22 NOTE — Telephone Encounter (Signed)
 Requesting: NAPROXEN 500MG  TABLETS  Last Visit: 05/05/2023 Next Visit: Visit date not found Last Refill: 05/05/2023  Please Advise

## 2023-05-23 ENCOUNTER — Encounter: Payer: Self-pay | Admitting: Emergency Medicine

## 2023-05-23 ENCOUNTER — Ambulatory Visit: Admission: EM | Admit: 2023-05-23 | Discharge: 2023-05-23

## 2023-05-23 ENCOUNTER — Emergency Department (HOSPITAL_BASED_OUTPATIENT_CLINIC_OR_DEPARTMENT_OTHER)
Admission: EM | Admit: 2023-05-23 | Discharge: 2023-05-23 | Disposition: A | Discharge status: Home / Self Care | Attending: Emergency Medicine | Admitting: Emergency Medicine

## 2023-05-23 ENCOUNTER — Emergency Department (HOSPITAL_BASED_OUTPATIENT_CLINIC_OR_DEPARTMENT_OTHER)

## 2023-05-23 ENCOUNTER — Other Ambulatory Visit: Payer: Self-pay

## 2023-05-23 ENCOUNTER — Encounter (HOSPITAL_BASED_OUTPATIENT_CLINIC_OR_DEPARTMENT_OTHER): Payer: Self-pay

## 2023-05-23 DIAGNOSIS — R200 Anesthesia of skin: Secondary | ICD-10-CM

## 2023-05-23 DIAGNOSIS — R202 Paresthesia of skin: Secondary | ICD-10-CM | POA: Diagnosis not present

## 2023-05-23 DIAGNOSIS — G43909 Migraine, unspecified, not intractable, without status migrainosus: Secondary | ICD-10-CM | POA: Diagnosis not present

## 2023-05-23 DIAGNOSIS — R29818 Other symptoms and signs involving the nervous system: Secondary | ICD-10-CM | POA: Diagnosis not present

## 2023-05-23 DIAGNOSIS — G43109 Migraine with aura, not intractable, without status migrainosus: Secondary | ICD-10-CM | POA: Insufficient documentation

## 2023-05-23 DIAGNOSIS — R001 Bradycardia, unspecified: Secondary | ICD-10-CM | POA: Diagnosis not present

## 2023-05-23 LAB — DIFFERENTIAL
Abs Immature Granulocytes: 0.01 10*3/uL (ref 0.00–0.07)
Basophils Absolute: 0 10*3/uL (ref 0.0–0.1)
Basophils Relative: 0 %
Eosinophils Absolute: 0.1 10*3/uL (ref 0.0–0.5)
Eosinophils Relative: 1 %
Immature Granulocytes: 0 %
Lymphocytes Relative: 53 %
Lymphs Abs: 2.8 10*3/uL (ref 0.7–4.0)
Monocytes Absolute: 0.4 10*3/uL (ref 0.1–1.0)
Monocytes Relative: 7 %
Neutro Abs: 2.1 10*3/uL (ref 1.7–7.7)
Neutrophils Relative %: 39 %

## 2023-05-23 LAB — URINALYSIS, MICROSCOPIC (REFLEX): RBC / HPF: >50 RBC/hpf (ref 0–5)

## 2023-05-23 LAB — CBC
HCT: 38.3 % (ref 36.0–46.0)
Hemoglobin: 13.1 g/dL (ref 12.0–15.0)
MCH: 29.6 pg (ref 26.0–34.0)
MCHC: 34.2 g/dL (ref 30.0–36.0)
MCV: 86.7 fL (ref 80.0–100.0)
Platelets: 313 10*3/uL (ref 150–400)
RBC: 4.42 MIL/uL (ref 3.87–5.11)
RDW: 13 % (ref 11.5–15.5)
WBC: 5.3 10*3/uL (ref 4.0–10.5)
nRBC: 0 % (ref 0.0–0.2)

## 2023-05-23 LAB — URINALYSIS, ROUTINE W REFLEX MICROSCOPIC
Bilirubin Urine: NEGATIVE
Glucose, UA: NEGATIVE mg/dL
Ketones, ur: NEGATIVE mg/dL
Nitrite: NEGATIVE
Protein, ur: NEGATIVE mg/dL
Specific Gravity, Urine: 1.015 (ref 1.005–1.030)
pH: 7 (ref 5.0–8.0)

## 2023-05-23 LAB — COMPREHENSIVE METABOLIC PANEL WITH GFR
ALT: 20 U/L (ref 0–44)
AST: 18 U/L (ref 15–41)
Albumin: 4 g/dL (ref 3.5–5.0)
Alkaline Phosphatase: 73 U/L (ref 38–126)
Anion gap: 9 (ref 5–15)
BUN: 17 mg/dL (ref 6–20)
CO2: 20 mmol/L — ABNORMAL LOW (ref 22–32)
Calcium: 9.4 mg/dL (ref 8.9–10.3)
Chloride: 109 mmol/L (ref 98–111)
Creatinine, Ser: 0.77 mg/dL (ref 0.44–1.00)
GFR, Estimated: >60 mL/min (ref >60)
Glucose, Bld: 79 mg/dL (ref 70–99)
Potassium: 4.1 mmol/L (ref 3.5–5.1)
Sodium: 138 mmol/L (ref 135–145)
Total Bilirubin: 0.5 mg/dL (ref 0.0–1.2)
Total Protein: 7.8 g/dL (ref 6.5–8.1)

## 2023-05-23 LAB — RAPID URINE DRUG SCREEN, HOSP PERFORMED
Amphetamines: NOT DETECTED
Barbiturates: NOT DETECTED
Benzodiazepines: NOT DETECTED
Cocaine: NOT DETECTED
Opiates: NOT DETECTED
Tetrahydrocannabinol: NOT DETECTED

## 2023-05-23 LAB — ETHANOL: Alcohol, Ethyl (B): <10 mg/dL (ref <10)

## 2023-05-23 LAB — PREGNANCY, URINE: Preg Test, Ur: NEGATIVE

## 2023-05-23 LAB — CBG MONITORING, ED: Glucose-Capillary: 71 mg/dL (ref 70–99)

## 2023-05-23 MED ORDER — METOCLOPRAMIDE HCL 5 MG/ML IJ SOLN
10.0000 mg | Freq: Once | INTRAMUSCULAR | Status: AC
  Administered 2023-05-23: 10 mg via INTRAVENOUS
  Filled 2023-05-23: qty 2

## 2023-05-23 MED ORDER — DIPHENHYDRAMINE HCL 50 MG/ML IJ SOLN
12.5000 mg | Freq: Once | INTRAMUSCULAR | Status: AC
  Administered 2023-05-23: 12.5 mg via INTRAVENOUS
  Filled 2023-05-23: qty 1

## 2023-05-23 MED ORDER — METOCLOPRAMIDE HCL 10 MG PO TABS: 10.0000 mg | ORAL_TABLET | Freq: Four times a day (QID) | ORAL | 0 refills | Status: DC

## 2023-05-23 MED ORDER — DIPHENHYDRAMINE HCL 50 MG/ML IJ SOLN: 25.0000 mg | Freq: Once | INTRAMUSCULAR | Status: DC

## 2023-05-23 MED ORDER — IOHEXOL 350 MG/ML SOLN
100.0000 mL | Freq: Once | INTRAVENOUS | Status: AC | PRN
  Administered 2023-05-23: 75 mL via INTRAVENOUS

## 2023-05-23 NOTE — Progress Notes (Signed)
 8119 Code stroke activated. Patient in CT. LKW reported as 0630 while at work when she noticed she was having right arm numbness. No weakness noted. Reported history included HTN and migraines. On topamax. Denies headache this am. mRS 0. Patient was already seen by ED provider prior to code stroke activation.  [phone] Teleneurologist paged. [phone] Dr. Bonnita Buttner connected to telestroke cart for code stroke evaluation. 2130 Additional orders for CT angiogram were added by Dr. Arora. Imaging complete and patient taken back to ED room 7. 1005 Code stroke session complete. No TNK offered due to low suspicion for stroke, non-disabling symptoms. Recommendations are to treat for migraine and if symptoms to not improve, patient may require transfer for MRI.Telestroke RN off cart at 1005.

## 2023-05-23 NOTE — Consult Note (Signed)
 [facility] Telemedicine Consult   Requesting Provider: Dr. Jarold Motto Consult Participants: Dr. Marthe Patch, Telespecialist RN Marchelle Folks   bedside RN Zella Ball Location of the provider: [facility] Location of the patient: Bed 7 [facility]  This consult was provided via telemedicine with 2-way video and audio communication. The patient/family was informed that care would be provided in this way and agreed to receive care in this manner.   Chief Complaint: Right arm numbness  HPI: 28 year old with history of hypertension, migraine on Topamax, depression, presented to the emergency department for right arm numbness with last known well around 6:30 AM.  She works the night shift at [employer] and noticed that the right arm had started to feel numb along with worsening tingling in the fingers.  She has had tingling in the fingers from her Topamax but this was different than the usual tingling. She went to an urgent care and was then transferred over to ER at [facility] for further evaluation where a code stroke was activated. She denies any headache at this time.  Reports very good control of her headaches with Topamax and says that she has not had a headache in a while.  No visual symptoms.  No weakness.  No gait incoordination.   Past Medical History:  Diagnosis Date   Arthritis    Depression    Hypertension    Migraine    Trichomoniasis     No current facility-administered medications for this encounter.  Current Outpatient Medications:    acetaminophen (TYLENOL) 650 MG CR tablet, Take 650 mg by mouth every 8 (eight) hours as needed for pain., Disp: , Rfl:    aspirin-acetaminophen-caffeine (EXCEDRIN MIGRAINE) 250-250-65 MG tablet, Take 1 tablet by mouth every 6 (six) hours as needed for headache or migraine., Disp: 30 tablet, Rfl: 0   clobetasol cream (TEMOVATE) 0.05 %, Apply 1 Application topically 2 (two) times daily. (Patient not taking: Reported on 05/05/2023), Disp: 30 g, Rfl: 0    naproxen (NAPROSYN) 500 MG tablet, TAKE 1 TABLET(500 MG) BY MOUTH TWICE DAILY WITH A MEAL, Disp: 30 tablet, Rfl: 0   NURTEC 75 MG TBDP, Take by mouth. (Patient not taking: Reported on 01/11/2023), Disp: , Rfl:    topiramate (TOPAMAX) 50 MG tablet, Take 2 tablets (100 mg total) by mouth at bedtime., Disp: 60 tablet, Rfl: 11   Vitamin D, Ergocalciferol, (DRISDOL) 1.25 MG (50000 UNIT) CAPS capsule, Take 1 capsule (50,000 Units total) by mouth every 7 (seven) days., Disp: 12 capsule, Rfl: 0    LKW: 6:30 AM IV thrombolysis given?: No, too mild treat  IR Thrombectomy? No, no ELVO Modified Rankin Scale: 0-Completely asymptomatic and back to baseline post- stroke Time of teleneurologist evaluation: 9:36 AM  Exam: Vitals:   05/23/23 0931  BP: 130/86  Pulse: 63  SpO2: 100%    General: Awake alert in no distress Neurological exam Awake alert oriented x 3, no dysarthria, no aphasia, cranial nerves II to XII intact.  Motor examination with no drift in any of the 4 extremities.  Sensory exam with diminished light touch on the right arm in comparison to the left.  Sensation is intact on left arm, right leg, left leg and face bilaterally.  Coordination examination reveals no dysmetria.   NIHSS 1A: Level of Consciousness - 0 1B: Ask Month and Age - 0 1C: 'Blink Eyes' & 'Squeeze Hands' - 0 2: Test Horizontal Extraocular Movements - 0 3: Test Visual Fields - 0 4: Test Facial Palsy - 0  5A: Test Left Arm Motor Drift - 0 5B: Test Right Arm Motor Drift - 0 6A: Test Left Leg Motor Drift - 0 6B: Test Right Leg Motor Drift - 0 7: Test Limb Ataxia - 0 8: Test Sensation - 1 9: Test Language/Aphasia- 0 10: Test Dysarthria - 0 11: Test Extinction/Inattention - 0 NIHSS score: 1   Imaging Reviewed: CT head-aspects 10.  No bleed.  CT angiogram head and neck with no ELVO  Labs reviewed in epic and pertinent values follow: CBC    Component Value Date/Time   WBC 4.4 05/05/2023 0846   RBC 4.33  05/05/2023 0846   HGB 12.8 05/05/2023 0846   HGB 13.9 05/27/2021 1441   HCT 38.3 05/05/2023 0846   HCT 42.7 05/27/2021 1441   PLT 325.0 05/05/2023 0846   PLT 323 05/27/2021 1441   MCV 88.5 05/05/2023 0846   MCV 86 05/27/2021 1441   MCH 28.0 05/27/2021 1441   MCH 29.2 07/09/2020 0828   MCHC 33.4 05/05/2023 0846   RDW 13.8 05/05/2023 0846   RDW 12.6 05/27/2021 1441   LYMPHSABS 2.2 05/05/2023 0846   LYMPHSABS 2.6 05/27/2021 1441   MONOABS 0.3 05/05/2023 0846   EOSABS 0.1 05/05/2023 0846   EOSABS 0.1 05/27/2021 1441   BASOSABS 0.0 05/05/2023 0846   BASOSABS 0.0 05/27/2021 1441   CMP -pending    Assessment: 28 year old with history of hypertension, migraine on Topamax, depression presenting for evaluation of right arm numbness with last known well at 6:30 AM.  On camera examination assisted with the bedside RN, neurological exam reveals minimal difference in sensation in the right arm in comparison to the left, otherwise neurological exam is intact. Brain imaging-CT head and CT angiography head and neck unremarkable for acute process or ELVO. NIH stroke scale is barely a 1 and due to mild symptoms, decision not to treat with TNK.  Also given the fact that she has a history of migraines, this could reflect a complex migraine type process although she denies any preceding or current headache. At this time I would treat her like complicated migraine with a migraine cocktail and if symptoms do not improve, consider transfer for MRI to the tertiary center  Impression: Isolated right arm numbness- ?Complicated migraine  Recommendations:  Migraine cocktail Bolus IV fluids Reassess after migraine cocktail and fluids Follow-up CMP and replete electrolytes as necessary If the symptoms improve with the above treatment, no further workup needed. If the symptoms persist or do not improve with the above treatment, consider transferring to [facility] for MRI. Plan was discussed with the  patient over the camera and Dr. Adan Holms over the phone.   -- Tona Francis, MD Neurologist [facility] Pager: [phone]

## 2023-05-23 NOTE — ED Provider Notes (Signed)
 [facility]    CSN: [id number] Arrival date & time: 05/23/23  8413      History   Chief Complaint No chief complaint on file.   HPI Rachael Hahn is a 28 y.o. female.   Rachael Hahn is a 28 year old female presents for evaluation of sudden-onset numbness and tingling of the right arm, which began around 6:30 this morning while at work. The symptoms extend from her fingertips to her shoulder. She denies any involvement of the face, left arm, or lower extremities. No associated headache, dizziness, visual changes, speech difficulties, chest pain, shortness of breath, or palpitations. No recent illness, injury, or travel. She has a known history of migraines and has been on Topamax 50 mg daily since November 2024, prescribed by neurology. She reports intermittent numbness in her right hand over the past month, which was attributed to the medication. She is also taking amlodipine and vitamin D. No new medications. Denies tobacco, drug, or vape use. Stroke risk factors include hypertension and family history. No personal history of stroke.         Past Medical History:  Diagnosis Date   Arthritis    Depression    Hypertension    Migraine    Trichomoniasis     Patient Active Problem List   Diagnosis Date Noted   Routine general medical examination at a health care facility 05/05/2023   Numbness and tingling 05/05/2023   Dry skin 05/05/2023   Vaginal irritation 02/23/2023   Blurry vision 12/29/2022   Palpitation 11/02/2022   Primary hypertension 06/08/2022   Chronic migraine w/o aura w/o status migrainosus, not intractable 06/08/2022   Vitamin D deficiency 06/08/2022   Obesity (BMI 30-39.9) 06/08/2022    History reviewed. No pertinent surgical history.  OB History   No obstetric history on file.      Home Medications    Prior to Admission medications   Medication Sig Start Date End Date Taking? Authorizing Provider  acetaminophen (TYLENOL) 650  MG CR tablet Take 650 mg by mouth every 8 (eight) hours as needed for pain.    [provider]  aspirin-acetaminophen-caffeine (EXCEDRIN MIGRAINE) [phone] MG tablet Take 1 tablet by mouth every 6 (six) hours as needed for headache or migraine. 12/08/22   Wallis Bamberg, PA-C  clobetasol cream (TEMOVATE) 0.05 % Apply 1 Application topically 2 (two) times daily. Patient not taking: Reported on 05/05/2023 02/23/23   McElwee, Leotis Shames A, NP  naproxen (NAPROSYN) 500 MG tablet TAKE 1 TABLET(500 MG) BY MOUTH TWICE DAILY WITH A MEAL 05/22/23   McElwee, Lauren A, NP  NURTEC 75 MG TBDP Take by mouth. Patient not taking: Reported on 01/11/2023 12/22/22   [provider]  topiramate (TOPAMAX) 50 MG tablet Take 2 tablets (100 mg total) by mouth at bedtime. 12/29/22   Levert Feinstein, MD  Vitamin D, Ergocalciferol, (DRISDOL) 1.25 MG (50000 UNIT) CAPS capsule Take 1 capsule (50,000 Units total) by mouth every 7 (seven) days. 05/08/23   McElwee, Jake Church, NP    Family History Family History  Problem Relation Age of Onset   Hypertension Mother        off medication   Hypertension Father    Diabetes Maternal Aunt    Hypertension Maternal Aunt     Social History Social History   Tobacco Use   Smoking status: Never   Smokeless tobacco: Never  Vaping Use   Vaping status: Never Used  Substance Use Topics   Alcohol use: Yes    Comment:  occasionally   Drug use: Not Currently     Allergies   Amoxicillin and Penicillin g   Review of Systems Review of Systems  Constitutional: Negative.   HENT: Negative.    Eyes:  Negative for visual disturbance.  Respiratory:  Negative for shortness of breath.   Cardiovascular:  Negative for chest pain, palpitations and leg swelling.  Gastrointestinal:  Negative for abdominal pain, diarrhea, nausea and vomiting.  Musculoskeletal:  Negative for gait problem.  Neurological:  Positive for numbness. Negative for dizziness, speech difficulty, weakness and  headaches.  All other systems reviewed and are negative.    Physical Exam Triage Vital Signs ED Triage Vitals  Encounter Vitals Group     BP 05/23/23 0832 110/74     Systolic BP Percentile --      Diastolic BP Percentile --      Pulse Rate 05/23/23 0832 (!) 59     Resp 05/23/23 0832 16     Temp 05/23/23 0832 98 F (36.7 C)     Temp Source 05/23/23 0832 Oral     SpO2 05/23/23 0832 97 %     Weight --      Height --      Head Circumference --      Peak Flow --      Pain Score 05/23/23 0831 0     Pain Loc --      Pain Education --      Exclude from Growth Chart --    No data found.  Updated Vital Signs BP 110/74 (BP Location: Left Arm)   Pulse (!) 59   Temp 98 F (36.7 C) (Oral)   Resp 16   LMP 05/18/2023 (Approximate)   SpO2 97%   Visual Acuity Right Eye Distance:   Left Eye Distance:   Bilateral Distance:    Right Eye Near:   Left Eye Near:    Bilateral Near:     Physical Exam Vitals reviewed.  Constitutional:      General: She is awake. She is not in acute distress.    Appearance: Normal appearance. She is well-developed. She is not ill-appearing, toxic-appearing or diaphoretic.  HENT:     Head: Normocephalic.     Right Ear: Hearing normal.     Left Ear: Hearing normal.     Nose: Nose normal.     Mouth/Throat:     Lips: Pink.     Mouth: Mucous membranes are moist.     Pharynx: Oropharynx is clear. Uvula midline.  Eyes:     Conjunctiva/sclera: Conjunctivae normal.  Cardiovascular:     Rate and Rhythm: Normal rate and regular rhythm.     Heart sounds: Normal heart sounds.  Pulmonary:     Effort: Pulmonary effort is normal.     Breath sounds: Normal breath sounds.  Musculoskeletal:        General: Normal range of motion.     Cervical back: Normal range of motion and neck supple.  Skin:    General: Skin is warm and dry.  Neurological:     General: No focal deficit present.     Mental Status: She is alert and oriented to person, place, and time.      GCS: GCS eye subscore is 4. GCS verbal subscore is 5. GCS motor subscore is 6.     Cranial Nerves: Cranial nerves 2-12 are intact. No cranial nerve deficit.     Sensory: Sensory deficit present.     Motor: Motor function is  intact. No weakness.     Coordination: Coordination is intact. Finger-Nose-Finger Test normal.     Gait: Gait is intact.  Psychiatric:        Behavior: Behavior is cooperative.      UC Treatments / Results  Labs (all labs ordered are listed, but only abnormal results are displayed) Labs Reviewed - No data to display  EKG   Radiology No results found.  Procedures Procedures (including critical care time)  Medications Ordered in UC Medications - No data to display  Initial Impression / Assessment and Plan / UC Course  I have reviewed the triage vital signs and the nursing notes.  Pertinent labs & imaging results that were available during my care of the patient were reviewed by me and considered in my medical decision making (see chart for details).    28 year old female presents with sudden-onset right upper extremity numbness and tingling. Vitals are stable; she is afebrile and normotensive. On exam, she is alert and oriented with no focal neurological deficits. Strength is equal bilaterally, with no drift or limb ataxia. Mild sensory changes are noted with decreased sensation to light touch in the right upper extremity, though she is still able to perceive touch. NIH stroke scale is 1. While the patient's symptoms may be related to her Topamax, which she has been taking for migraine prophylaxis, the acute nature and extent of the symptoms raise concern. Given the sudden onset, the distribution of symptoms, and her hypertension and family history, stroke cannot be ruled out definitively at this time. Out of an abundance of caution, a prompt ED referral is recommended for further evaluation including head imaging and neurologic workup. The patient is stable  and agreeable to transport via private vehicle with her significant other.  Documentation was completed with the aid of voice recognition software. Transcription may contain typographical errors. Final Clinical Impressions(s) / UC Diagnoses   Final diagnoses:  Numbness and tingling of right arm   Discharge Instructions   None    ED Prescriptions   None    PDMP not reviewed this encounter.   Maryruth Sol, [place] 05/23/23 [phone]

## 2023-05-23 NOTE — ED Triage Notes (Signed)
 Pt states she is having numbness and tingling in her right arm that started last night. She denies any pain. No known injury.

## 2023-05-23 NOTE — Discharge Instructions (Addendum)
 You were seen for your numbness in the emergency department. It was likely from a complicated migraine. You were given medicines which improved your symptoms.  At home, please take Tylenol and ibuprofen for your headache.  You may also take the Reglan we have prescribed you for your headache or any nausea or vomiting that you have.  You may take this with Benadryl for severe headaches but please note that this will make you drowsy.    Check your MyChart online for the results of any tests that had not resulted by the time you left the emergency department.   Follow-up with your primary doctor in 2-3 days regarding your visit.  Follow-up with neurology as well.   Return immediately to the emergency department if you experience any of the following: Vision changes, numbness or weakness of your arms or legs, or any other concerning symptoms.    Thank you for visiting our Emergency Department. It was a pleasure taking care of you today.

## 2023-05-23 NOTE — ED Notes (Signed)
 Patient is being discharged from the Urgent Care and sent to the Emergency Department via POV . Per Maryruth Sol NP, patient is in need of higher level of care due to Arm Numbness and Tingling. Patient is aware and verbalizes understanding of plan of care.  Vitals:   05/23/23 0832  BP: 110/74  Pulse: (!) 59  Resp: 16  Temp: 98 F (36.7 C)  SpO2: 97%

## 2023-05-23 NOTE — ED Notes (Signed)
 ED provider in triage.

## 2023-05-23 NOTE — ED Provider Notes (Signed)
 [facility] Provider Note   CSN: [id number] Arrival date & time: 05/23/23  7829     History  Chief Complaint  Patient presents with   Numbness    Rachael Hahn is a 28 y.o. female.  28 year old female with a history of migraines on Topamax who presents emergency department with right upper extremity numbness.  Patient reports that this morning at 6:30 AM she was at work and noticed that her right arm went numb.  Says that it feels like her arm is not there from her shoulder down to her hand.  Has had some pins and needle sensation as well.  No weakness.  No headache or neck pain.  Says that over the past month would have intermittent tingling in her fingers that she attributed to the Topamax that she was started on.  Went to urgent care and referred to the emergency department for stroke evaluation.  Not on blood thinners.  No bleeding recently. No hx of stroke.        Home Medications Prior to Admission medications   Medication Sig Start Date End Date Taking? Authorizing Provider  metoCLOPramide (REGLAN) 10 MG tablet Take 1 tablet (10 mg total) by mouth every 6 (six) hours. 05/23/23  Yes Rondel Baton, MD  acetaminophen (TYLENOL) 650 MG CR tablet Take 650 mg by mouth every 8 (eight) hours as needed for pain.    [provider]  aspirin-acetaminophen-caffeine (EXCEDRIN MIGRAINE) [phone] MG tablet Take 1 tablet by mouth every 6 (six) hours as needed for headache or migraine. 12/08/22   Wallis Bamberg, PA-C  clobetasol cream (TEMOVATE) 0.05 % Apply 1 Application topically 2 (two) times daily. Patient not taking: Reported on 05/05/2023 02/23/23   McElwee, Leotis Shames A, NP  naproxen (NAPROSYN) 500 MG tablet TAKE 1 TABLET(500 MG) BY MOUTH TWICE DAILY WITH A MEAL 05/22/23   McElwee, Lauren A, NP  NURTEC 75 MG TBDP Take by mouth. Patient not taking: Reported on 01/11/2023 12/22/22   [provider]  topiramate (TOPAMAX) 50 MG  tablet Take 2 tablets (100 mg total) by mouth at bedtime. 12/29/22   Levert Feinstein, MD  Vitamin D, Ergocalciferol, (DRISDOL) 1.25 MG (50000 UNIT) CAPS capsule Take 1 capsule (50,000 Units total) by mouth every 7 (seven) days. 05/08/23   McElwee, Jake Church, NP      Allergies    Amoxicillin and Penicillin g    Review of Systems   Review of Systems  Physical Exam Updated Vital Signs BP 135/87   Pulse 72   Temp 98.2 F (36.8 C) (Oral)   Resp 20   LMP 05/18/2023 (Approximate)   SpO2 100%  Physical Exam Vitals and nursing note reviewed.  Constitutional:      General: She is not in acute distress.    Appearance: She is well-developed.  HENT:     Head: Normocephalic and atraumatic.     Right Ear: External ear normal.     Left Ear: External ear normal.     Nose: Nose normal.  Eyes:     Extraocular Movements: Extraocular movements intact.     Conjunctiva/sclera: Conjunctivae normal.     Pupils: Pupils are equal, round, and reactive to light.  Cardiovascular:     Rate and Rhythm: Normal rate and regular rhythm.  Pulmonary:     Effort: Pulmonary effort is normal. No respiratory distress.  Musculoskeletal:     Cervical back: Normal range of motion and neck supple.  Right lower leg: No edema.     Left lower leg: No edema.  Skin:    General: Skin is warm and dry.  Neurological:     Mental Status: She is alert and oriented to person, place, and time.     Comments: NIHSS Exam  Level of Consciousness: Alert  LOC Questions: Answers Month and Age Correctly  LOC Commands: Opens and Closes Eyes and Hands on command  Best Gaze: Horizontal ocular movements intact  Visual Fields: No visual field loss  Facial Palsy: None  L Upper Extremity Motor: No drift after 10 seconds  R Upper Extremity Motor: No drift after 10 seconds  L Lower extremity Motor: No drift after 5 seconds  R Lower extremity Motor: No drift after 5 seconds  Ataxia: Absent  Sensory: Diminished sensation right upper  extremity Best Language: No aphasia  Dysarthria: No dysarthria  Neglect: No visual or sensory neglect    Psychiatric:        Mood and Affect: Mood normal.     ED Results / Procedures / Treatments   Labs (all labs ordered are listed, but only abnormal results are displayed) Labs Reviewed  COMPREHENSIVE METABOLIC PANEL WITH GFR - Abnormal; Notable for the following components:      Result Value   CO2 20 (*)    All other components within normal limits  URINALYSIS, ROUTINE W REFLEX MICROSCOPIC - Abnormal; Notable for the following components:   APPearance CLOUDY (*)    Hgb urine dipstick LARGE (*)    Leukocytes,Ua TRACE (*)    All other components within normal limits  URINALYSIS, MICROSCOPIC (REFLEX) - Abnormal; Notable for the following components:   Bacteria, UA FEW (*)    All other components within normal limits  ETHANOL  CBC  DIFFERENTIAL  RAPID URINE DRUG SCREEN, HOSP PERFORMED  PREGNANCY, URINE  CBG MONITORING, ED    EKG EKG Interpretation Date/Time:  Tuesday May 23 2023 09:26:44 EDT Ventricular Rate:  57 PR Interval:  170 QRS Duration:  84 QT Interval:  402 QTC Calculation: 391 R Axis:   108  Text Interpretation: Sinus bradycardia with sinus arrhythmia Rightward axis Borderline ECG When compared with ECG of 09-Jul-2020 08:15, PREVIOUS ECG IS PRESENT Confirmed by Vonita Moss [phone]) on 05/23/2023 9:38:57 AM  Radiology CT ANGIO HEAD NECK W WO CM (CODE STROKE) Result Date: 05/23/2023 CLINICAL DATA:  Code stroke. Neuro deficit, acute, stroke suspected. Additional history provided: Numbness and tingling in right arm. EXAM: CT ANGIOGRAPHY HEAD AND NECK TECHNIQUE: Multidetector CT imaging of the head and neck was performed using the standard protocol during bolus administration of intravenous contrast. Multiplanar CT image reconstructions and MIPs were obtained to evaluate the vascular anatomy. Carotid stenosis measurements (when applicable) are obtained utilizing  NASCET criteria, using the distal internal carotid diameter as the denominator. RADIATION DOSE REDUCTION: This exam was performed according to the departmental dose-optimization program which includes automated exposure control, adjustment of the mA and/or kV according to patient size and/or use of iterative reconstruction technique. CONTRAST:  75mL OMNIPAQUE IOHEXOL 350 MG/ML SOLN COMPARISON:  None. FINDINGS: CT HEAD FINDINGS Brain: Cerebral volume is normal. There is no acute intracranial hemorrhage. No demarcated cortical infarct. No extra-axial fluid collection. No evidence of an intracranial mass. No midline shift. Vascular: No hyperdense vessel. Skull: No calvarial fracture or aggressive osseous lesion. Sinuses/Orbits: No orbital mass or acute orbital finding. No significant paranasal sinus disease. ASPECTS [facility] Stroke Program Early CT Score) - Ganglionic level infarction (caudate, lentiform nuclei,  internal capsule, insula, M1-M3 cortex): 7 - Supraganglionic infarction (M4-M6 cortex): 3 Total score (0-10 with 10 being normal): 10 No evidence of an acute intracranial abnormality. These results were communicated to Dr. Bonnita Buttner at 10:08 amon 4/15/2025by text page via the [facility] messaging system. Review of the MIP images confirms the above findings CTA NECK FINDINGS Aortic arch: Standard aortic branching. The visualized thoracic aorta is normal in caliber. Streak/beam hardening artifact arising from a dense contrast bolus partially obscures the left subclavian artery. Within this limitation, there is no appreciable hemodynamically significant innominate or proximal subclavian artery stenosis. Right carotid system: CCA and ICA patent within the neck without stenosis. No evidence of dissection. Left carotid system: CCA and ICA patent within the neck without stenosis. No evidence of dissection. Vertebral arteries: Codominant and patent within the neck without stenosis. No evidence of dissection. Skeleton:  Nonspecific reversal of the expected cervical lordosis. No acute fracture or aggressive osseous lesion. Other neck: No neck mass or cervical lymphadenopathy. Upper chest: No consolidation within the imaged lung apices. Review of the MIP images confirms the above findings CTA HEAD FINDINGS Anterior circulation: The intracranial internal carotid arteries are patent. The M1 middle cerebral arteries are patent. No M2 proximal branch occlusion or high-grade proximal stenosis. The anterior cerebral arteries are patent. No intracranial aneurysm is identified. Posterior circulation: The intracranial vertebral arteries are patent. The basilar artery is patent. The posterior cerebral arteries are patent. Posterior communicating arteries are present bilaterally. Venous sinuses: Within the limitations of contrast timing, no convincing thrombus. Anatomic variants: None significant. Review of the MIP images confirms the above findings No emergent large vessel occlusion identified. These results were communicated to Dr. Bonnita Buttner at 10:23 amon 4/15/2025by text page via the [facility] messaging system. IMPRESSION: Non-contrast head CT: No evidence of an acute intracranial abnormality. CTA neck: The common carotid, internal carotid and vertebral arteries are patent within the neck without stenosis. No evidence of dissection. CTA head: No proximal intracranial large vessel occlusion or high-grade proximal arterial stenosis identified. Electronically Signed   By: Bascom Lily D.O.   On: 05/23/2023 10:24   CT HEAD CODE STROKE WO CONTRAST Result Date: 05/23/2023 CLINICAL DATA:  Code stroke. Neuro deficit, acute, stroke suspected. Additional history provided: Numbness and tingling in right arm. EXAM: CT ANGIOGRAPHY HEAD AND NECK TECHNIQUE: Multidetector CT imaging of the head and neck was performed using the standard protocol during bolus administration of intravenous contrast. Multiplanar CT image reconstructions and MIPs were obtained to  evaluate the vascular anatomy. Carotid stenosis measurements (when applicable) are obtained utilizing NASCET criteria, using the distal internal carotid diameter as the denominator. RADIATION DOSE REDUCTION: This exam was performed according to the departmental dose-optimization program which includes automated exposure control, adjustment of the mA and/or kV according to patient size and/or use of iterative reconstruction technique. CONTRAST:  75mL OMNIPAQUE IOHEXOL 350 MG/ML SOLN COMPARISON:  None. FINDINGS: CT HEAD FINDINGS Brain: Cerebral volume is normal. There is no acute intracranial hemorrhage. No demarcated cortical infarct. No extra-axial fluid collection. No evidence of an intracranial mass. No midline shift. Vascular: No hyperdense vessel. Skull: No calvarial fracture or aggressive osseous lesion. Sinuses/Orbits: No orbital mass or acute orbital finding. No significant paranasal sinus disease. ASPECTS [facility] Stroke Program Early CT Score) - Ganglionic level infarction (caudate, lentiform nuclei, internal capsule, insula, M1-M3 cortex): 7 - Supraganglionic infarction (M4-M6 cortex): 3 Total score (0-10 with 10 being normal): 10 No evidence of an acute intracranial abnormality. These results were communicated to Dr. Arora  at 10:08 amon 4/15/2025by text page via the [facility] messaging system. Review of the MIP images confirms the above findings CTA NECK FINDINGS Aortic arch: Standard aortic branching. The visualized thoracic aorta is normal in caliber. Streak/beam hardening artifact arising from a dense contrast bolus partially obscures the left subclavian artery. Within this limitation, there is no appreciable hemodynamically significant innominate or proximal subclavian artery stenosis. Right carotid system: CCA and ICA patent within the neck without stenosis. No evidence of dissection. Left carotid system: CCA and ICA patent within the neck without stenosis. No evidence of dissection. Vertebral arteries:  Codominant and patent within the neck without stenosis. No evidence of dissection. Skeleton: Nonspecific reversal of the expected cervical lordosis. No acute fracture or aggressive osseous lesion. Other neck: No neck mass or cervical lymphadenopathy. Upper chest: No consolidation within the imaged lung apices. Review of the MIP images confirms the above findings CTA HEAD FINDINGS Anterior circulation: The intracranial internal carotid arteries are patent. The M1 middle cerebral arteries are patent. No M2 proximal branch occlusion or high-grade proximal stenosis. The anterior cerebral arteries are patent. No intracranial aneurysm is identified. Posterior circulation: The intracranial vertebral arteries are patent. The basilar artery is patent. The posterior cerebral arteries are patent. Posterior communicating arteries are present bilaterally. Venous sinuses: Within the limitations of contrast timing, no convincing thrombus. Anatomic variants: None significant. Review of the MIP images confirms the above findings No emergent large vessel occlusion identified. These results were communicated to Dr. Wilford Corner at 10:23 amon 4/15/2025by text page via the [facility] messaging system. IMPRESSION: Non-contrast head CT: No evidence of an acute intracranial abnormality. CTA neck: The common carotid, internal carotid and vertebral arteries are patent within the neck without stenosis. No evidence of dissection. CTA head: No proximal intracranial large vessel occlusion or high-grade proximal arterial stenosis identified. Electronically Signed   By: Jackey Loge D.O.   On: 05/23/2023 10:24    Procedures Procedures    Medications Ordered in ED Medications  iohexol (OMNIPAQUE) 350 MG/ML injection 100 mL (75 mLs Intravenous Contrast Given 05/23/23 0941)  metoCLOPramide (REGLAN) injection 10 mg (10 mg Intravenous Given 05/23/23 1018)  diphenhydrAMINE (BENADRYL) injection 12.5 mg (12.5 mg Intravenous Given 05/23/23 1017)    ED  Course/ Medical Decision Making/ A&P Clinical Course as of 05/23/23 1431  Tue May 23, 2023  1006 Dr. Wilford Corner from neurology consulted. Says that that she is not being a headache currently but feels that she should be treated for complicated migraine if symptoms persist will need to be transferred for MRI but feels that stroke is less likely. [RP]    Clinical Course User Index [RP] Rondel Baton, MD                                 Medical Decision Making Amount and/or Complexity of Data Reviewed Labs: ordered. Radiology: ordered.  Risk Prescription drug management.   Rachael Hahn is a 28 y.o. female with comorbidities that complicate the patient evaluation including migraines who presents emergency department with right upper extremity numbness  Initial Ddx:  Stroke, ICH, hypoglycemia, radiculopathy, complicated migraine  MDM:  Concerned about a stroke given the patient's symptoms and since she is within the window for TNK we will go ahead and activate a code stroke.  Patient is protecting airway at this time.  No significant headache or neck pain at this time suggest dissection but since it is a common cause of  stroke in young individuals will obtain a CTA.  Will check a CBG to ensure that the patient is not hypoglycemic and a CT of the head to evaluate for ICH.  No signs of infection currently on history or exam to suggest an infectious cause of their symptoms.  Plan:  CBG Labs Code stroke activation CT head  ED Summary/Re-evaluation:  CT head and CTA unremarkable.  Was discussed with neurology who felt that due to her mild symptoms TNK was not indicated.  There was some concern for possible complicated migraine so patient was given a migraine cocktail.  Her symptoms resolved.  It was felt to be consistent with a complicated migraine.  Will have her follow-up with her primary doctor and neurology for continued evaluation.  This patient presents to the ED for concern of  complaints listed in HPI, this involves an extensive number of treatment options, and is a complaint that carries with it a high risk of complications and morbidity. Disposition including potential need for admission considered.   Dispo: DC Home. Return precautions discussed including, but not limited to, those listed in the AVS. Allowed pt time to ask questions which were answered fully prior to dc.  Records reviewed Outpatient Clinic Notes The following labs were independently interpreted: Chemistry and show no acute abnormality I independently reviewed the following imaging with scope of interpretation limited to determining acute life threatening conditions related to emergency care: CT Head and agree with the radiologist interpretation with the following exceptions: none I personally reviewed and interpreted cardiac monitoring: normal sinus rhythm  I personally reviewed and interpreted the pt's EKG: see above for interpretation  I have reviewed the patients home medications and made adjustments as needed Consults: Neurology  CRITICAL CARE Performed by: Ninetta Basket   Total critical care time: 30 minutes  Critical care time was exclusive of separately billable procedures and treating other patients.  Critical care was necessary to treat or prevent imminent or life-threatening deterioration.  Critical care was time spent personally by me on the following activities: development of treatment plan with patient and/or surrogate as well as nursing, discussions with consultants, evaluation of patient's response to treatment, examination of patient, obtaining history from patient or surrogate, ordering and performing treatments and interventions, ordering and review of laboratory studies, ordering and review of radiographic studies, pulse oximetry and re-evaluation of patient's condition.   Final Clinical Impression(s) / ED Diagnoses Final diagnoses:  Complicated migraine  Right upper  extremity numbness    Rx / DC Orders ED Discharge Orders          Ordered    metoCLOPramide (REGLAN) 10 MG tablet  Every 6 hours        05/23/23 1055              Ninetta Basket, MD 05/23/23 1431

## 2023-05-24 ENCOUNTER — Encounter: Payer: Self-pay | Admitting: Nurse Practitioner

## 2023-05-24 MED ORDER — TOPIRAMATE 50 MG PO TABS: 100.0000 mg | ORAL_TABLET | Freq: Every evening | ORAL | 11 refills | Status: AC

## 2023-05-24 NOTE — Telephone Encounter (Signed)
 Requesting: Topiramate 50mg  Last Visit: 05/05/2023 Next Visit: Visit date not found Last Refill: 12/29/22 by Dr. Phebe Brasil  Please Advise

## 2023-06-07 ENCOUNTER — Other Ambulatory Visit: Payer: Self-pay | Admitting: Nurse Practitioner

## 2023-06-08 NOTE — Telephone Encounter (Signed)
 Requesting: NAPROXEN  500MG  TABLETS  Last Visit: 05/05/2023 Next Visit: Visit date not found Last Refill: 05/22/2023  Please Advise

## 2023-06-29 ENCOUNTER — Ambulatory Visit: Admitting: Nurse Practitioner

## 2023-07-10 ENCOUNTER — Encounter: Payer: Self-pay | Admitting: Nurse Practitioner

## 2023-07-10 NOTE — Telephone Encounter (Signed)
 Requesting: Topamax  Last Visit: 05/05/2023 Next Visit: Visit date not found Last Refill: 05/24/23 with 11 refills  Please Advise

## 2023-07-13 ENCOUNTER — Other Ambulatory Visit: Payer: Self-pay | Admitting: Nurse Practitioner

## 2023-07-13 DIAGNOSIS — I10 Essential (primary) hypertension: Secondary | ICD-10-CM

## 2023-07-13 NOTE — Telephone Encounter (Signed)
 Copied from CRM [id number]. Topic: General - Other >> Jul 13, 2023 12:04 PM Allyne Areola wrote: Reason for CRM: Patient is returning a call she received from [place], regarding AMLODIPINE  BESYLATE 5MG  TABLETS. Patient states she does need a refill and was requesting it.

## 2023-07-13 NOTE — Telephone Encounter (Signed)
 Left message for patient to return call. I wanted to verify if patient requested a refill of if this was an automated refill.   Requesting: AMLODIPINE  BESYLATE 5MG  TABLETS  Last Visit: 05/05/2023 Next Visit: Visit date not found Last Refill: The original prescription was discontinued on 05/05/2023 by Odette Benjamin, NP. Renewing this prescription may not be appropriate.   Please Advise

## 2023-07-26 ENCOUNTER — Ambulatory Visit
Admission: RE | Admit: 2023-07-26 | Discharge: 2023-07-26 | Disposition: A | Discharge status: Home / Self Care | Source: Ambulatory Visit | Attending: Emergency Medicine | Admitting: Emergency Medicine

## 2023-07-26 VITALS — BP 125/81 | HR 60 | Temp 98.2°F | Resp 17

## 2023-07-26 DIAGNOSIS — R051 Acute cough: Secondary | ICD-10-CM | POA: Diagnosis not present

## 2023-07-26 DIAGNOSIS — R062 Wheezing: Secondary | ICD-10-CM | POA: Diagnosis not present

## 2023-07-26 DIAGNOSIS — R0981 Nasal congestion: Secondary | ICD-10-CM

## 2023-07-26 MED ORDER — ALBUTEROL SULFATE HFA 108 (90 BASE) MCG/ACT IN AERS: 1.0000 | INHALATION_SPRAY | Freq: Four times a day (QID) | RESPIRATORY_TRACT | 0 refills | Status: AC | PRN

## 2023-07-26 MED ORDER — PREDNISONE 10 MG (21) PO TBPK: ORAL_TABLET | Freq: Every day | ORAL | 0 refills | Status: DC

## 2023-07-26 NOTE — ED Triage Notes (Signed)
 Pt present with c/o a body aches, cough and fever X few hours. Pt has taken Daqyuil, las dose around 5am - with no relief.

## 2023-07-26 NOTE — Discharge Instructions (Addendum)
 Use inhaler as needed for cough or shortness of breath The symptoms are more viral in nature and no antibiotic is needed Continue to take over-the-counter DayQuil Coughs can persist for several weeks Using humidifier at home in the evening If symptoms become worse you will need to follow-up in the ER or your primary care

## 2023-07-26 NOTE — ED Provider Notes (Signed)
 [facility]    CSN: [id number] Arrival date & time: 07/26/23  0846      History   Chief Complaint Chief Complaint  Patient presents with   Cough    Flu symptoms - Entered by patient    HPI Rachael Hahn is a 28 y.o. female.   Patient presents today with bodyaches, congestion for the past few hours.  Patient noted while she was at work last night she was feeling well.  Took DayQuil with no relief.  Unknown fevers no nausea vomiting or diarrhea    Past Medical History:  Diagnosis Date   Arthritis    Depression    Hypertension    Migraine    Trichomoniasis     Patient Active Problem List   Diagnosis Date Noted   Routine general medical examination at a health care facility 05/05/2023   Numbness and tingling 05/05/2023   Dry skin 05/05/2023   Vaginal irritation 02/23/2023   Blurry vision 12/29/2022   Palpitation 11/02/2022   Primary hypertension 06/08/2022   Chronic migraine w/o aura w/o status migrainosus, not intractable 06/08/2022   Vitamin D  deficiency 06/08/2022   Obesity (BMI 30-39.9) 06/08/2022    History reviewed. No pertinent surgical history.  OB History   No obstetric history on file.      Home Medications    Prior to Admission medications   Medication Sig Start Date End Date Taking? Authorizing Provider  albuterol (VENTOLIN HFA) 108 (90 Base) MCG/ACT inhaler Inhale 1-2 puffs into the lungs every 6 (six) hours as needed for wheezing or shortness of breath. 07/26/23  Yes Harden Leyden, NP  predniSONE (STERAPRED UNI-PAK 21 TAB) 10 MG (21) TBPK tablet Take by mouth daily. Take 6 tabs by mouth daily  for 2 days, then 5 tabs for 2 days, then 4 tabs for 2 days, then 3 tabs for 2 days, 2 tabs for 2 days, then 1 tab by mouth daily for 2 days 07/26/23  Yes Harden Leyden, NP  acetaminophen  (TYLENOL ) 650 MG CR tablet Take 650 mg by mouth every 8 (eight) hours as needed for pain.    [provider]  amLODipine  (NORVASC ) 5 MG  tablet TAKE 1 TABLET(5 MG) BY MOUTH DAILY 07/13/23   McElwee, Lauren A, NP  aspirin-acetaminophen -caffeine (EXCEDRIN  MIGRAINE) 250-250-65 MG tablet Take 1 tablet by mouth every 6 (six) hours as needed for headache or migraine. 12/08/22   Adolph Hoop, PA-C  clobetasol  cream (TEMOVATE ) 0.05 % Apply 1 Application topically 2 (two) times daily. Patient not taking: Reported on 05/05/2023 02/23/23   Odette Benjamin, NP  metoCLOPramide  (REGLAN ) 10 MG tablet Take 1 tablet (10 mg total) by mouth every 6 (six) hours. 05/23/23   Ninetta Basket, MD  naproxen  (NAPROSYN ) 500 MG tablet TAKE 1 TABLET(500 MG) BY MOUTH TWICE DAILY WITH A MEAL 06/08/23   McElwee, Lauren A, NP  NURTEC 75 MG TBDP Take by mouth. Patient not taking: Reported on 01/11/2023 12/22/22   [provider]  topiramate  (TOPAMAX ) 50 MG tablet Take 2 tablets (100 mg total) by mouth at bedtime. 05/24/23   McElwee, Lauren A, NP  Vitamin D , Ergocalciferol , (DRISDOL ) 1.25 MG (50000 UNIT) CAPS capsule Take 1 capsule (50,000 Units total) by mouth every 7 (seven) days. 05/08/23   McElwee, Adolfo Hooker, NP    Family History Family History  Problem Relation Age of Onset   Hypertension Mother        off medication   Hypertension Father  Diabetes Maternal Aunt    Hypertension Maternal Aunt     Social History Social History   Tobacco Use   Smoking status: Never   Smokeless tobacco: Never  Vaping Use   Vaping status: Never Used  Substance Use Topics   Alcohol use: Yes    Comment: occasionally   Drug use: Not Currently     Allergies   Amoxicillin and Penicillin g   Review of Systems Review of Systems  Constitutional:  Negative for fever.  HENT:  Positive for postnasal drip and sinus pain.   Respiratory:  Positive for cough and wheezing. Negative for shortness of breath.   Cardiovascular: Negative.   Gastrointestinal: Negative.   Genitourinary: Negative.   Musculoskeletal:        Generalized body aches  Skin: Negative.       Physical Exam Triage Vital Signs ED Triage Vitals  Encounter Vitals Group     BP 07/26/23 0908 125/81     Girls Systolic BP Percentile --      Girls Diastolic BP Percentile --      Boys Systolic BP Percentile --      Boys Diastolic BP Percentile --      Pulse Rate 07/26/23 0908 60     Resp 07/26/23 0908 17     Temp 07/26/23 0908 98.2 F (36.8 C)     Temp Source 07/26/23 0908 Oral     SpO2 07/26/23 0908 97 %     Weight --      Height --      Head Circumference --      Peak Flow --      Pain Score 07/26/23 0907 0     Pain Loc --      Pain Education --      Exclude from Growth Chart --    No data found.  Updated Vital Signs BP 125/81 (BP Location: Right Arm)   Pulse 60   Temp 98.2 F (36.8 C) (Oral)   Resp 17   LMP 07/10/2023 (Approximate)   SpO2 97%   Visual Acuity Right Eye Distance:   Left Eye Distance:   Bilateral Distance:    Right Eye Near:   Left Eye Near:    Bilateral Near:     Physical Exam Constitutional:      Appearance: Normal appearance.  HENT:     Right Ear: Tympanic membrane normal.     Left Ear: Tympanic membrane normal.     Nose: Congestion present.     Mouth/Throat:     Mouth: Mucous membranes are moist.   Eyes:     Pupils: Pupils are equal, round, and reactive to light.    Cardiovascular:     Rate and Rhythm: Normal rate.     Pulses: Normal pulses.  Pulmonary:     Effort: Pulmonary effort is normal.  Abdominal:     General: Abdomen is flat.   Musculoskeletal:        General: Normal range of motion.     Cervical back: Normal range of motion.   Skin:    General: Skin is warm.   Neurological:     General: No focal deficit present.     Mental Status: She is alert.      UC Treatments / Results  Labs (all labs ordered are listed, but only abnormal results are displayed) Labs Reviewed - No data to display  EKG   Radiology No results found.  Procedures Procedures (including critical care time)  Medications  Ordered in UC Medications - No data to display  Initial Impression / Assessment and Plan / UC Course  I have reviewed the triage vital signs and the nursing notes.  Pertinent labs & imaging results that were available during my care of the patient were reviewed by me and considered in my medical decision making (see chart for details).     Use inhaler as needed for cough or shortness of breath The symptoms are more viral in nature and no antibiotic is needed Continue to take over-the-counter DayQuil Coughs can persist for several weeks Using humidifier at home in the evening If symptoms become worse you will need to follow-up in the ER or your primary care Final Clinical Impressions(s) / UC Diagnoses   Final diagnoses:  Acute cough  Nasal congestion  Wheezing     Discharge Instructions      Use inhaler as needed for cough or shortness of breath The symptoms are more viral in nature and no antibiotic is needed Continue to take over-the-counter DayQuil Coughs can persist for several weeks Using humidifier at home in the evening If symptoms become worse you will need to follow-up in the ER or your primary care    ED Prescriptions     Medication Sig Dispense Auth. Provider   predniSONE (STERAPRED UNI-PAK 21 TAB) 10 MG (21) TBPK tablet Take by mouth daily. Take 6 tabs by mouth daily  for 2 days, then 5 tabs for 2 days, then 4 tabs for 2 days, then 3 tabs for 2 days, 2 tabs for 2 days, then 1 tab by mouth daily for 2 days 42 tablet Horacio Lyell L, NP   albuterol (VENTOLIN HFA) 108 (90 Base) MCG/ACT inhaler Inhale 1-2 puffs into the lungs every 6 (six) hours as needed for wheezing or shortness of breath. 18 g Harden Leyden, NP      PDMP not reviewed this encounter.   Harden Leyden, NP 07/26/23 [phone]

## 2023-08-04 ENCOUNTER — Emergency Department (HOSPITAL_BASED_OUTPATIENT_CLINIC_OR_DEPARTMENT_OTHER)
Admission: EM | Admit: 2023-08-04 | Discharge: 2023-08-04 | Disposition: A | Discharge status: Home / Self Care | Attending: Emergency Medicine | Admitting: Emergency Medicine

## 2023-08-04 ENCOUNTER — Other Ambulatory Visit: Payer: Self-pay

## 2023-08-04 ENCOUNTER — Emergency Department (HOSPITAL_BASED_OUTPATIENT_CLINIC_OR_DEPARTMENT_OTHER)

## 2023-08-04 ENCOUNTER — Ambulatory Visit
Admission: EM | Admit: 2023-08-04 | Discharge: 2023-08-04 | Disposition: A | Discharge status: Other Acute Inpatient Hospital | Attending: Family Medicine | Admitting: Family Medicine

## 2023-08-04 ENCOUNTER — Encounter (HOSPITAL_BASED_OUTPATIENT_CLINIC_OR_DEPARTMENT_OTHER): Payer: Self-pay | Admitting: Emergency Medicine

## 2023-08-04 DIAGNOSIS — I10 Essential (primary) hypertension: Secondary | ICD-10-CM | POA: Diagnosis not present

## 2023-08-04 DIAGNOSIS — R079 Chest pain, unspecified: Secondary | ICD-10-CM | POA: Diagnosis not present

## 2023-08-04 DIAGNOSIS — Z79899 Other long term (current) drug therapy: Secondary | ICD-10-CM | POA: Diagnosis not present

## 2023-08-04 DIAGNOSIS — R0789 Other chest pain: Secondary | ICD-10-CM | POA: Insufficient documentation

## 2023-08-04 LAB — BASIC METABOLIC PANEL WITH GFR
Anion gap: 13 (ref 5–15)
BUN: 15 mg/dL (ref 6–20)
CO2: 22 mmol/L (ref 22–32)
Calcium: 9.5 mg/dL (ref 8.9–10.3)
Chloride: 106 mmol/L (ref 98–111)
Creatinine, Ser: 0.76 mg/dL (ref 0.44–1.00)
GFR, Estimated: >60 mL/min (ref >60)
Glucose, Bld: 85 mg/dL (ref 70–99)
Potassium: 3.6 mmol/L (ref 3.5–5.1)
Sodium: 141 mmol/L (ref 135–145)

## 2023-08-04 LAB — TROPONIN T, HIGH SENSITIVITY: Troponin T High Sensitivity: <15 ng/L (ref <19)

## 2023-08-04 LAB — RESP PANEL BY RT-PCR (RSV, FLU A&B, COVID)  RVPGX2
Influenza A by PCR: NEGATIVE
Influenza B by PCR: NEGATIVE
Resp Syncytial Virus by PCR: NEGATIVE
SARS Coronavirus 2 by RT PCR: NEGATIVE

## 2023-08-04 LAB — CBC
HCT: 41.3 % (ref 36.0–46.0)
Hemoglobin: 14 g/dL (ref 12.0–15.0)
MCH: 29.3 pg (ref 26.0–34.0)
MCHC: 33.9 g/dL (ref 30.0–36.0)
MCV: 86.4 fL (ref 80.0–100.0)
Platelets: 350 10*3/uL (ref 150–400)
RBC: 4.78 MIL/uL (ref 3.87–5.11)
RDW: 13.2 % (ref 11.5–15.5)
WBC: 12.1 10*3/uL — ABNORMAL HIGH (ref 4.0–10.5)
nRBC: 0 % (ref 0.0–0.2)

## 2023-08-04 LAB — PREGNANCY, URINE: Preg Test, Ur: NEGATIVE

## 2023-08-04 MED ORDER — HYDROCODONE BIT-HOMATROP MBR 5-1.5 MG/5ML PO SOLN: 5.0000 mL | Freq: Four times a day (QID) | ORAL | 0 refills | Status: DC | PRN

## 2023-08-04 NOTE — ED Provider Notes (Addendum)
 [facility]    CSN: [id number] Arrival date & time: 08/04/23  1824      History   Chief Complaint No chief complaint on file.   HPI Rachael Hahn is a 28 y.o. female presents for chest pain.  Patient reports 2 weeks of a nonradiating left-sided intermittent chest pain that is a sharp pain.  It is associated with shortness of breath but denies nausea vomiting dizziness, palpitations, syncope.  Does report she has had some cough and congestion over the past couple weeks.  Was seen in urgent care on 618 for a few days of URI symptoms.  No testing or chest x-ray was done and she was prescribed albuterol  and prednisone .  She states this has not helped her chest pain and is only persistent.  Denies any lower extremity swelling orthopnea.  No history of PE or DVT.  No history of asthma or smoking.  No other concerns at this time.  HPI  Past Medical History:  Diagnosis Date   Arthritis    Depression    Hypertension    Migraine    Trichomoniasis     Patient Active Problem List   Diagnosis Date Noted   Routine general medical examination at a health care facility 05/05/2023   Numbness and tingling 05/05/2023   Dry skin 05/05/2023   Vaginal irritation 02/23/2023   Blurry vision 12/29/2022   Palpitation 11/02/2022   Primary hypertension 06/08/2022   Chronic migraine w/o aura w/o status migrainosus, not intractable 06/08/2022   Vitamin D  deficiency 06/08/2022   Obesity (BMI 30-39.9) 06/08/2022    History reviewed. No pertinent surgical history.  OB History   No obstetric history on file.      Home Medications    Prior to Admission medications   Medication Sig Start Date End Date Taking? Authorizing Provider  acetaminophen  (TYLENOL ) 650 MG CR tablet Take 650 mg by mouth every 8 (eight) hours as needed for pain.    [provider]  albuterol  (VENTOLIN  HFA) 108 (90 Base) MCG/ACT inhaler Inhale 1-2 puffs into the lungs every 6 (six) hours as needed for  wheezing or shortness of breath. 07/26/23   Merilee Andrea CROME, NP  amLODipine  (NORVASC ) 5 MG tablet TAKE 1 TABLET(5 MG) BY MOUTH DAILY 07/13/23   McElwee, Tinnie LABOR, NP  aspirin-acetaminophen -caffeine (EXCEDRIN  MIGRAINE) 250-250-65 MG tablet Take 1 tablet by mouth every 6 (six) hours as needed for headache or migraine. 12/08/22   Christopher Savannah, PA-C  clobetasol  cream (TEMOVATE ) 0.05 % Apply 1 Application topically 2 (two) times daily. Patient not taking: Reported on 05/05/2023 02/23/23   Nedra Tinnie LABOR, NP  metoCLOPramide  (REGLAN ) 10 MG tablet Take 1 tablet (10 mg total) by mouth every 6 (six) hours. 05/23/23   Yolande Lamar BROCKS, MD  naproxen  (NAPROSYN ) 500 MG tablet TAKE 1 TABLET(500 MG) BY MOUTH TWICE DAILY WITH A MEAL 06/08/23   McElwee, Lauren A, NP  NURTEC 75 MG TBDP Take by mouth. Patient not taking: Reported on 01/11/2023 12/22/22   [provider]  predniSONE  (STERAPRED UNI-PAK 21 TAB) 10 MG (21) TBPK tablet Take by mouth daily. Take 6 tabs by mouth daily  for 2 days, then 5 tabs for 2 days, then 4 tabs for 2 days, then 3 tabs for 2 days, 2 tabs for 2 days, then 1 tab by mouth daily for 2 days 07/26/23   Merilee Andrea CROME, NP  topiramate  (TOPAMAX ) 50 MG tablet Take 2 tablets (100 mg total) by mouth at bedtime. 05/24/23  McElwee, Lauren A, NP  Vitamin D , Ergocalciferol , (DRISDOL ) 1.25 MG (50000 UNIT) CAPS capsule Take 1 capsule (50,000 Units total) by mouth every 7 (seven) days. 05/08/23   McElwee, Tinnie LABOR, NP    Family History Family History  Problem Relation Age of Onset   Hypertension Mother        off medication   Hypertension Father    Diabetes Maternal Aunt    Hypertension Maternal Aunt     Social History Social History   Tobacco Use   Smoking status: Never   Smokeless tobacco: Never  Vaping Use   Vaping status: Never Used  Substance Use Topics   Alcohol use: Yes    Comment: occasionally   Drug use: Not Currently     Allergies   Amoxicillin and Penicillin  g   Review of Systems Review of Systems  Respiratory:  Positive for shortness of breath.   Cardiovascular:  Positive for chest pain.     Physical Exam Triage Vital Signs ED Triage Vitals  Encounter Vitals Group     BP 08/04/23 1844 (!) 142/96     Girls Systolic BP Percentile --      Girls Diastolic BP Percentile --      Boys Systolic BP Percentile --      Boys Diastolic BP Percentile --      Pulse Rate 08/04/23 1844 (!) 56     Resp 08/04/23 1844 17     Temp 08/04/23 1844 97.8 F (36.6 C)     Temp Source 08/04/23 1844 Oral     SpO2 08/04/23 1844 97 %     Weight --      Height --      Head Circumference --      Peak Flow --      Pain Score 08/04/23 1842 8     Pain Loc --      Pain Education --      Exclude from Growth Chart --    No data found.  Updated Vital Signs BP (!) 142/96   Pulse (!) 56   Temp 97.8 F (36.6 C) (Oral)   Resp 17   LMP 07/10/2023 (Approximate)   SpO2 97%   Visual Acuity Right Eye Distance:   Left Eye Distance:   Bilateral Distance:    Right Eye Near:   Left Eye Near:    Bilateral Near:     Physical Exam Vitals and nursing note reviewed.  Constitutional:      General: She is not in acute distress.    Appearance: Normal appearance. She is not ill-appearing.  HENT:     Head: Normocephalic and atraumatic.   Eyes:     Pupils: Pupils are equal, round, and reactive to light.    Cardiovascular:     Rate and Rhythm: Regular rhythm. Tachycardia present.     Heart sounds: Normal heart sounds.  Pulmonary:     Effort: Pulmonary effort is normal.     Breath sounds: Normal breath sounds. No wheezing, rhonchi or rales.  Chest:     Chest wall: Tenderness present.   Neurological:     General: No focal deficit present.     Mental Status: She is alert and oriented to person, place, and time.   Psychiatric:        Mood and Affect: Mood normal.        Behavior: Behavior normal.    PERC Criteria for low probability for Pulmonary  Embolism  Age <50 years  Heart rate <100 beats/minute  Oxyhemoglobin saturation >=95 percent  No hemoptysis  No estrogen use  No prior DVT or PE  No unilateral leg swelling  No surgery/trauma requiring hospitalization within the prior four weeks  Total score: 0    UC Treatments / Results  Labs (all labs ordered are listed, but only abnormal results are displayed) Labs Reviewed - No data to display  EKG   Radiology No results found.  Procedures ED EKG  Date/Time: 08/04/2023 7:25 PM  Performed by: Loreda Myla SAUNDERS, NP Authorized by: Loreda Myla SAUNDERS, NP   ECG interpreted by ED Physician in the absence of a cardiologist: no   Previous ECG:    Previous ECG:  Compared to current   Similarity:  Changes noted Rate:    ECG rate:  57   ECG rate assessment: bradycardic   Rhythm:    Rhythm: sinus rhythm   Ectopy:    Ectopy: none   QRS:    QRS axis:  Right T waves:    T waves: non-specific    (including critical care time)  Medications Ordered in UC Medications - No data to display  Initial Impression / Assessment and Plan / UC Course  I have reviewed the triage vital signs and the nursing notes.  Pertinent labs & imaging results that were available during my care of the patient were reviewed by me and considered in my medical decision making (see chart for details).     I reviewed exam and symptoms with patient.  Patient presenting with 2 weeks of cough/congestion with left-sided chest pain.  EKG was done does show some T wave changes compared to previous EKG from April.  Discussed potentials including pneumonia versus musculoskeletal versus cardiac versus PE.  Given her symptoms, and change in EKG and the fact that I do not have onsite x-ray today and would not get those results until tomorrow,  advised she go to the emergency room for further workup.  She is in agreement with plan will go POV to the ER. Final Clinical Impressions(s) / UC Diagnoses   Final  diagnoses:  Chest pain, unspecified type     Discharge Instructions      Please go to the emergency room for further evaluation of your symptoms    ED Prescriptions   None    PDMP not reviewed this encounter.   Loreda Myla SAUNDERS, NP 08/04/23 1928    Loreda Myla SAUNDERS, NP 08/04/23 1929

## 2023-08-04 NOTE — ED Notes (Signed)
 Patient is being discharged from the Urgent Care and sent to the Emergency Department via POV . Per Myla Bold, NP, patient is in need of higher level of care due to needing higher level of care. Patient is aware and verbalizes understanding of plan of care.  Vitals:   08/04/23 1844  BP: (!) 142/96  Pulse: (!) 56  Resp: 17  Temp: 97.8 F (36.6 C)  SpO2: 97%

## 2023-08-04 NOTE — Discharge Instructions (Addendum)
 Please go to the emergency room for further evaluation of your symptoms

## 2023-08-04 NOTE — Discharge Instructions (Signed)
 Your blood work, chest x-ray were all reassuring.  No concerning cause of your chest pain.  This is likely musculoskeletal in nature.  This could include costochondritis which is the joints where the rib bones attach to the breastbone, or a muscle strain.  Take ibuprofen  600 mg 3 times a day and follow-up with your primary care doctor.  I have sent in a cough syrup given you are having significant coughing spells.  Return for any concerning symptoms.

## 2023-08-04 NOTE — ED Provider Notes (Signed)
 [place] [facility] AT [facility] Provider Note   CSN: [id number] Arrival date & time: 08/04/23  1945     Patient presents with: Chest Pain   Rachael Hahn is a 28 y.o. female.   Patient reports 2 weeks of a nonradiating left-sided intermittent chest pain that is a sharp stabbing pain that is intermittent.  denies nausea vomiting dizziness, palpitations, syncope, or shortness of breath.  Reports recent diagnosis of URI that was treated with prednisone , albuterol .  She was seen at urgent care on 6/18.  She returns to the urgent care today for ongoing chest pain.  They noted some EKG changes and was referred to the emergency department.  Denies any recent long travel, recent surgery, prior history of DVT, or PE.  Still endorses ongoing coughing spells.  Pain is worse when she presses on left side of her chest.  The history is provided by the patient. No language interpreter was used.       Prior to Admission medications   Medication Sig Start Date End Date Taking? Authorizing Provider  HYDROcodone bit-homatropine (HYCODAN) 5-1.5 MG/5ML syrup Take 5 mLs by mouth every 6 (six) hours as needed for cough. 08/04/23  Yes Hildegard, Baruch Lewers, PA-C  acetaminophen  (TYLENOL ) 650 MG CR tablet Take 650 mg by mouth every 8 (eight) hours as needed for pain.    [provider]  albuterol  (VENTOLIN  HFA) 108 (90 Base) MCG/ACT inhaler Inhale 1-2 puffs into the lungs every 6 (six) hours as needed for wheezing or shortness of breath. 07/26/23   Merilee Andrea CROME, NP  amLODipine  (NORVASC ) 5 MG tablet TAKE 1 TABLET(5 MG) BY MOUTH DAILY 07/13/23   McElwee, Tinnie LABOR, NP  aspirin-acetaminophen -caffeine (EXCEDRIN  MIGRAINE) 250-250-65 MG tablet Take 1 tablet by mouth every 6 (six) hours as needed for headache or migraine. 12/08/22   Christopher Savannah, PA-C  clobetasol  cream (TEMOVATE ) 0.05 % Apply 1 Application topically 2 (two) times daily. Patient not taking: Reported on 05/05/2023 02/23/23    Nedra Tinnie LABOR, NP  metoCLOPramide  (REGLAN ) 10 MG tablet Take 1 tablet (10 mg total) by mouth every 6 (six) hours. 05/23/23   Yolande Lamar BROCKS, MD  naproxen  (NAPROSYN ) 500 MG tablet TAKE 1 TABLET(500 MG) BY MOUTH TWICE DAILY WITH A MEAL 06/08/23   McElwee, Lauren A, NP  NURTEC 75 MG TBDP Take by mouth. Patient not taking: Reported on 01/11/2023 12/22/22   [provider]  predniSONE  (STERAPRED UNI-PAK 21 TAB) 10 MG (21) TBPK tablet Take by mouth daily. Take 6 tabs by mouth daily  for 2 days, then 5 tabs for 2 days, then 4 tabs for 2 days, then 3 tabs for 2 days, 2 tabs for 2 days, then 1 tab by mouth daily for 2 days 07/26/23   Merilee Andrea CROME, NP  topiramate  (TOPAMAX ) 50 MG tablet Take 2 tablets (100 mg total) by mouth at bedtime. 05/24/23   McElwee, Lauren A, NP  Vitamin D , Ergocalciferol , (DRISDOL ) 1.25 MG (50000 UNIT) CAPS capsule Take 1 capsule (50,000 Units total) by mouth every 7 (seven) days. 05/08/23   McElwee, Lauren A, NP    Allergies: Amoxicillin and Penicillin g    Review of Systems  Constitutional:  Negative for fever.  Respiratory:  Negative for shortness of breath.   Cardiovascular:  Positive for chest pain. Negative for palpitations and leg swelling.  Neurological:  Negative for light-headedness.    Updated Vital Signs BP 121/76   Pulse (!) 57   Temp 98.1 F (36.7  C) (Oral)   Resp 19   Ht 5' 4 (1.626 m)   Wt 95.7 kg   LMP 07/10/2023 (Approximate)   SpO2 99%   BMI 36.22 kg/m   Physical Exam Vitals and nursing note reviewed.  Constitutional:      General: She is not in acute distress.    Appearance: Normal appearance. She is not ill-appearing.  HENT:     Head: Normocephalic and atraumatic.     Nose: Nose normal.   Eyes:     Conjunctiva/sclera: Conjunctivae normal.    Cardiovascular:     Rate and Rhythm: Normal rate and regular rhythm.     Comments: Reproducible left-sided chest wall tenderness Pulmonary:     Effort: Pulmonary effort is  normal. No respiratory distress.   Musculoskeletal:        General: No deformity. Normal range of motion.     Cervical back: Normal range of motion.   Skin:    Findings: No rash.   Neurological:     Mental Status: She is alert.     (all labs ordered are listed, but only abnormal results are displayed) Labs Reviewed  CBC - Abnormal; Notable for the following components:      Result Value   WBC 12.1 (*)    All other components within normal limits  RESP PANEL BY RT-PCR (RSV, FLU A&B, COVID)  RVPGX2  BASIC METABOLIC PANEL WITH GFR  PREGNANCY, URINE  TROPONIN T, HIGH SENSITIVITY  TROPONIN T, HIGH SENSITIVITY    EKG: EKG Interpretation Date/Time:  Friday August 04 2023 19:52:09 EDT Ventricular Rate:  65 PR Interval:  157 QRS Duration:  91 QT Interval:  376 QTC Calculation: 391 R Axis:   115  Text Interpretation: Sinus rhythm Right axis deviation no sig change from previous Confirmed by Armenta Canning [phone]) on 08/04/2023 9:40:17 PM  Radiology: [facility] Chest 2 View Result Date: 08/04/2023 CLINICAL DATA:  chest pain EXAM: CHEST - 2 VIEW COMPARISON:  None available. FINDINGS: No focal airspace consolidation, pleural effusion, or pneumothorax. No cardiomegaly.No acute fracture or destructive lesion. Bilateral nipple adornments. IMPRESSION: No acute cardiopulmonary abnormality. Electronically Signed   By: Rogelia Myers M.D.   On: 08/04/2023 20:47     Procedures   Medications Ordered in the ED - No data to display                                  Medical Decision Making Amount and/or Complexity of Data Reviewed Labs: ordered. Radiology: ordered.  Risk Prescription drug management.   Medical Decision Making / ED Course   This patient presents to the ED for concern of chest pain, this involves an extensive number of treatment options, and is a complaint that carries with it a high risk of complications and morbidity.  The differential diagnosis includes ACS, PE,  pneumonia, MSK pain, GERD  MDM: 28 year old female presents today for chest pain. Low heart score. Atypical in nature. Reproducible on exam. Troponin negative.  Given duration of symptoms do not feel we need a repeat.  CBC without acute concern.  BMP unremarkable, respiratory panel negative.  Pregnancy test negative.  Chest x-ray without acute cardiopulmonary process.  EKG without acute ischemic change. Likely costochondritis or muscle strain. Symptomatic management discussed.  Discussed close follow-up with PCP.  Lab Tests: -I ordered, reviewed, and interpreted labs.   The pertinent results include:   Labs Reviewed  CBC - Abnormal; Notable  for the following components:      Result Value   WBC 12.1 (*)    All other components within normal limits  RESP PANEL BY RT-PCR (RSV, FLU A&B, COVID)  RVPGX2  BASIC METABOLIC PANEL WITH GFR  PREGNANCY, URINE  TROPONIN T, HIGH SENSITIVITY  TROPONIN T, HIGH SENSITIVITY      EKG  EKG Interpretation Date/Time:  Friday August 04 2023 19:52:09 EDT Ventricular Rate:  65 PR Interval:  157 QRS Duration:  91 QT Interval:  376 QTC Calculation: 391 R Axis:   115  Text Interpretation: Sinus rhythm Right axis deviation no sig change from previous Confirmed by Armenta Canning [phone]) on 08/04/2023 9:40:17 PM         Imaging Studies ordered: I ordered imaging studies including chest x-ray I independently visualized and interpreted imaging. I agree with the radiologist interpretation   Medicines ordered and prescription drug management: Meds ordered this encounter  Medications   HYDROcodone bit-homatropine (HYCODAN) 5-1.5 MG/5ML syrup    Sig: Take 5 mLs by mouth every 6 (six) hours as needed for cough.    Dispense:  120 mL    Refill:  0    Supervising Provider:   CLEOTILDE, BRIAN [[id number]]    -I have reviewed the patients home medicines and have made adjustments as needed    Cardiac Monitoring: The patient was maintained on a cardiac  monitor.  I personally viewed and interpreted the cardiac monitored which showed an underlying rhythm of: Normal sinus rhythm  Social Determinants of Health:  Factors impacting patients care include: Good outpatient follow-up   Reevaluation: After the interventions noted above, I reevaluated the patient and found that they have :stayed the same  Co morbidities that complicate the patient evaluation  Past Medical History:  Diagnosis Date   Arthritis    Depression    Hypertension    Migraine    Trichomoniasis       Dispostion: Discharged in stable condition.  Return precaution discussed.  Patient voices understanding and is in agreement with plan.    Final diagnoses:  Chest wall pain    ED Discharge Orders          Ordered    HYDROcodone bit-homatropine (HYCODAN) 5-1.5 MG/5ML syrup  Every 6 hours PRN        08/04/23 2147               Hildegard Loge, PA-C 08/04/23 2154    Armenta Canning, MD 08/09/23 1600

## 2023-08-04 NOTE — ED Notes (Signed)
 Pt. Reports cough and prednisone  since 19th of June.  Pt. Said  she was having  chestpain and was sent to [facility] ED for further Eval.

## 2023-08-04 NOTE — ED Triage Notes (Signed)
 Pt c/o non radiating left sided chest painx2wks. Pt denies N/V, SOB

## 2023-08-04 NOTE — ED Triage Notes (Signed)
 Patient coming to ED for evaluation of chest pain x several days.  Reports pain has been intermittent.  Was seen at Urgent Care prior to coming to ED.  Was told that her EKG today was different from previous.  No reports of SHOB.  No recent travel or leg pain

## 2023-08-08 ENCOUNTER — Ambulatory Visit
Admission: RE | Admit: 2023-08-08 | Discharge: 2023-08-08 | Disposition: A | Discharge status: Home / Self Care | Source: Ambulatory Visit | Attending: Family Medicine | Admitting: Family Medicine

## 2023-08-08 ENCOUNTER — Ambulatory Visit: Payer: Self-pay

## 2023-08-08 VITALS — BP 136/81 | HR 64 | Temp 98.7°F | Resp 16

## 2023-08-08 DIAGNOSIS — R519 Headache, unspecified: Secondary | ICD-10-CM

## 2023-08-08 MED ORDER — EXCEDRIN MIGRAINE 250-250-65 MG PO TABS: 1.0000 | ORAL_TABLET | Freq: Three times a day (TID) | ORAL | 0 refills | Status: DC | PRN

## 2023-08-08 NOTE — ED Provider Notes (Signed)
 [facility]  Note:  This document was prepared using Conservation officer, historic buildings and may include unintentional dictation errors.  MRN: [id number] DOB: 11-28-1995  Subjective:   Ilani Otterson is a 28 y.o. female presenting for 1 day history of frontal headache with associated numbness along the same area.  No fever, confusion, weakness, numbness or tingling, vision changes, sinus symptoms, cough, chest pain, shortness of breath, heart racing, palpitations, nausea, vomiting, abdominal pain, neck pain, neck stiffness, rashes.  Has a history of migraine headaches but this feels very different to the patient.  Was seen in the emergency room 4 days ago for chest pain.  Workup was largely negative except for borderline leukocytosis.  Patient sometimes skips meals.  She does not drink any fluids but does not really know why.  She works 2 jobs at [employer] and another at [employer].  No current facility-administered medications for this encounter.  Current Outpatient Medications:    acetaminophen  (TYLENOL ) 650 MG CR tablet, Take 650 mg by mouth every 8 (eight) hours as needed for pain., Disp: , Rfl:    albuterol  (VENTOLIN  HFA) 108 (90 Base) MCG/ACT inhaler, Inhale 1-2 puffs into the lungs every 6 (six) hours as needed for wheezing or shortness of breath., Disp: 18 g, Rfl: 0   amLODipine  (NORVASC ) 5 MG tablet, TAKE 1 TABLET(5 MG) BY MOUTH DAILY, Disp: 90 tablet, Rfl: 0   aspirin-acetaminophen -caffeine (EXCEDRIN  MIGRAINE) 250-250-65 MG tablet, Take 1 tablet by mouth every 6 (six) hours as needed for headache or migraine., Disp: 30 tablet, Rfl: 0   clobetasol  cream (TEMOVATE ) 0.05 %, Apply 1 Application topically 2 (two) times daily. (Patient not taking: Reported on 05/05/2023), Disp: 30 g, Rfl: 0   HYDROcodone bit-homatropine (HYCODAN) 5-1.5 MG/5ML syrup, Take 5 mLs by mouth every 6 (six) hours as needed for cough., Disp: 120 mL, Rfl: 0   metoCLOPramide  (REGLAN ) 10 MG  tablet, Take 1 tablet (10 mg total) by mouth every 6 (six) hours., Disp: 15 tablet, Rfl: 0   naproxen  (NAPROSYN ) 500 MG tablet, TAKE 1 TABLET(500 MG) BY MOUTH TWICE DAILY WITH A MEAL, Disp: 30 tablet, Rfl: 1   NURTEC 75 MG TBDP, Take by mouth. (Patient not taking: Reported on 01/11/2023), Disp: , Rfl:    predniSONE  (STERAPRED UNI-PAK 21 TAB) 10 MG (21) TBPK tablet, Take by mouth daily. Take 6 tabs by mouth daily  for 2 days, then 5 tabs for 2 days, then 4 tabs for 2 days, then 3 tabs for 2 days, 2 tabs for 2 days, then 1 tab by mouth daily for 2 days, Disp: 42 tablet, Rfl: 0   topiramate  (TOPAMAX ) 50 MG tablet, Take 2 tablets (100 mg total) by mouth at bedtime., Disp: 60 tablet, Rfl: 11   Vitamin D , Ergocalciferol , (DRISDOL ) 1.25 MG (50000 UNIT) CAPS capsule, Take 1 capsule (50,000 Units total) by mouth every 7 (seven) days., Disp: 12 capsule, Rfl: 0   Allergies  Allergen Reactions   Amoxicillin Other (See Comments)    Patient states yeast infection    Penicillin G     Other Reaction(s): yeast inf    Past Medical History:  Diagnosis Date   Arthritis    Depression    Hypertension    Migraine    Trichomoniasis      History reviewed. No pertinent surgical history.  Family History  Problem Relation Age of Onset   Hypertension Mother        off medication   Hypertension  Father    Diabetes Maternal Aunt    Hypertension Maternal Aunt     Social History   Tobacco Use   Smoking status: Never   Smokeless tobacco: Never  Vaping Use   Vaping status: Never Used  Substance Use Topics   Alcohol use: Yes    Comment: occasionally   Drug use: Not Currently    ROS   Objective:   Vitals: BP 136/81 (BP Location: Left Arm)   Pulse 64   Temp 98.7 F (37.1 C) (Oral)   Resp 16   LMP 07/10/2023 (Approximate)   SpO2 96%   Physical Exam Constitutional:      General: She is not in acute distress.    Appearance: Normal appearance. She is well-developed and normal weight. She is  not ill-appearing, toxic-appearing or diaphoretic.  HENT:     Head: Normocephalic and atraumatic.     Right Ear: Tympanic membrane, ear canal and external ear normal. No drainage or tenderness. No middle ear effusion. There is no impacted cerumen. Tympanic membrane is not erythematous or bulging.     Left Ear: Tympanic membrane, ear canal and external ear normal. No drainage or tenderness.  No middle ear effusion. There is no impacted cerumen. Tympanic membrane is not erythematous or bulging.     Nose: Nose normal. No congestion or rhinorrhea.     Mouth/Throat:     Mouth: Mucous membranes are moist. No oral lesions.     Pharynx: No pharyngeal swelling, oropharyngeal exudate, posterior oropharyngeal erythema or uvula swelling.     Tonsils: No tonsillar exudate or tonsillar abscesses.   Eyes:     General: No scleral icterus.       Right eye: No discharge.        Left eye: No discharge.     Extraocular Movements: Extraocular movements intact.     Right eye: Normal extraocular motion.     Left eye: Normal extraocular motion.     Conjunctiva/sclera: Conjunctivae normal.   Neck:     Meningeal: Brudzinski's sign and Kernig's sign absent.   Cardiovascular:     Rate and Rhythm: Normal rate and regular rhythm.     Heart sounds: Normal heart sounds. No murmur heard.    No friction rub. No gallop.  Pulmonary:     Effort: Pulmonary effort is normal. No respiratory distress.     Breath sounds: No stridor. No wheezing, rhonchi or rales.  Chest:     Chest wall: No tenderness.   Musculoskeletal:     Cervical back: Normal range of motion and neck supple.  Lymphadenopathy:     Cervical: No cervical adenopathy.   Skin:    General: Skin is warm and dry.   Neurological:     General: No focal deficit present.     Mental Status: She is alert and oriented to person, place, and time.     Cranial Nerves: No cranial nerve deficit, dysarthria or facial asymmetry.     Motor: No weakness or pronator  drift.     Coordination: Romberg sign negative. Coordination normal. Finger-Nose-Finger Test and Heel to [facility] Test normal. Rapid alternating movements normal.     Gait: Gait and tandem walk normal.     Deep Tendon Reflexes: Reflexes normal.   Psychiatric:        Mood and Affect: Mood normal.        Behavior: Behavior normal.        Thought Content: Thought content normal.  Judgment: Judgment normal.    Recent Results (from the past 2160 hours)  Ethanol     Status: None   Collection Time: 05/23/23  9:32 AM  Result Value Ref Range   Alcohol, Ethyl (B) <10 <10 mg/dL    Comment: (NOTE) Lowest detectable limit for serum alcohol is 10 mg/dL.  For medical purposes only. Performed at [facility], [address] [place], [place] [address]   CBC     Status: None   Collection Time: 05/23/23  9:32 AM  Result Value Ref Range   WBC 5.3 4.0 - 10.5 K/uL   RBC 4.42 3.87 - 5.11 MIL/uL   Hemoglobin 13.1 12.0 - 15.0 g/dL   HCT 61.6 63.9 - 53.9 %   MCV 86.7 80.0 - 100.0 fL   MCH 29.6 26.0 - 34.0 pg   MCHC 34.2 30.0 - 36.0 g/dL   RDW 86.9 88.4 - 84.4 %   Platelets 313 150 - 400 K/uL   nRBC 0.0 0.0 - 0.2 %    Comment: Performed at [facility], [address] [place], [place] [address]  Differential     Status: None   Collection Time: 05/23/23  9:32 AM  Result Value Ref Range   Neutrophils Relative % 39 %   Neutro Abs 2.1 1.7 - 7.7 K/uL   Lymphocytes Relative 53 %   Lymphs Abs 2.8 0.7 - 4.0 K/uL   Monocytes Relative 7 %   Monocytes Absolute 0.4 0.1 - 1.0 K/uL   Eosinophils Relative 1 %   Eosinophils Absolute 0.1 0.0 - 0.5 K/uL   Basophils Relative 0 %   Basophils Absolute 0.0 0.0 - 0.1 K/uL   Immature Granulocytes 0 %   Abs Immature Granulocytes 0.01 0.00 - 0.07 K/uL    Comment: Performed at [facility], [address] [place], [place] [address]  Comprehensive metabolic panel     Status: Abnormal   Collection Time: 05/23/23  9:32 AM  Result  Value Ref Range   Sodium 138 135 - 145 mmol/L   Potassium 4.1 3.5 - 5.1 mmol/L   Chloride 109 98 - 111 mmol/L   CO2 20 (L) 22 - 32 mmol/L   Glucose, Bld 79 70 - 99 mg/dL    Comment: Glucose reference range applies only to samples taken after fasting for at least 8 hours.   BUN 17 6 - 20 mg/dL   Creatinine, Ser 9.22 0.44 - 1.00 mg/dL   Calcium 9.4 8.9 - 89.6 mg/dL   Total Protein 7.8 6.5 - 8.1 g/dL   Albumin 4.0 3.5 - 5.0 g/dL   AST 18 15 - 41 U/L   ALT 20 0 - 44 U/L   Alkaline Phosphatase 73 38 - 126 U/L   Total Bilirubin 0.5 0.0 - 1.2 mg/dL   GFR, Estimated >39 >39 mL/min    Comment: (NOTE) Calculated using the CKD-EPI Creatinine Equation (2021)    Anion gap 9 5 - 15    Comment: Performed at [facility], [address] [place], [place] [address]  Urine rapid drug screen (hosp performed)     Status: None   Collection Time: 05/23/23  9:32 AM  Result Value Ref Range   Opiates NONE DETECTED NONE DETECTED   Cocaine NONE DETECTED NONE DETECTED   Benzodiazepines NONE DETECTED NONE DETECTED   Amphetamines NONE DETECTED NONE DETECTED   Tetrahydrocannabinol NONE DETECTED NONE DETECTED   Barbiturates NONE DETECTED NONE  DETECTED    Comment: (NOTE) DRUG SCREEN FOR MEDICAL PURPOSES ONLY.  IF CONFIRMATION IS NEEDED FOR ANY PURPOSE, NOTIFY LAB WITHIN 5 DAYS.  LOWEST DETECTABLE LIMITS FOR URINE DRUG SCREEN Drug Class                     Cutoff (ng/mL) Amphetamine and metabolites    1000 Barbiturate and metabolites    200 Benzodiazepine                 200 Opiates and metabolites        300 Cocaine and metabolites        300 THC                            50 Performed at [facility], [address] [place], [place] [address]   Urinalysis, Routine w reflex microscopic -Urine, Clean Catch     Status: Abnormal   Collection Time: 05/23/23  9:32 AM  Result Value Ref Range   Color, Urine YELLOW YELLOW   APPearance CLOUDY (A) CLEAR   Specific Gravity, Urine  1.015 1.005 - 1.030   pH 7.0 5.0 - 8.0   Glucose, UA NEGATIVE NEGATIVE mg/dL   Hgb urine dipstick LARGE (A) NEGATIVE   Bilirubin Urine NEGATIVE NEGATIVE   Ketones, ur NEGATIVE NEGATIVE mg/dL   Protein, ur NEGATIVE NEGATIVE mg/dL   Nitrite NEGATIVE NEGATIVE   Leukocytes,Ua TRACE (A) NEGATIVE    Comment: Performed at [facility], [address] [place], [place] [address]  Pregnancy, urine     Status: None   Collection Time: 05/23/23  9:32 AM  Result Value Ref Range   Preg Test, Ur NEGATIVE NEGATIVE    Comment:        THE SENSITIVITY OF THIS METHODOLOGY IS >25 mIU/mL. Performed at [facility], [address] [place], [place] [address]   Urinalysis, Microscopic (reflex)     Status: Abnormal   Collection Time: 05/23/23  9:32 AM  Result Value Ref Range   RBC / HPF >50 0 - 5 RBC/hpf   WBC, UA 0-5 0 - 5 WBC/hpf   Bacteria, UA FEW (A) NONE SEEN   Squamous Epithelial / HPF 6-10 0 - 5 /HPF    Comment: Performed at [facility], [address] [place], [place] [address]  CBG monitoring, ED     Status: None   Collection Time: 05/23/23  9:40 AM  Result Value Ref Range   Glucose-Capillary 71 70 - 99 mg/dL    Comment: Glucose reference range applies only to samples taken after fasting for at least 8 hours.  Basic metabolic panel     Status: None   Collection Time: 08/04/23  7:53 PM  Result Value Ref Range   Sodium 141 135 - 145 mmol/L   Potassium 3.6 3.5 - 5.1 mmol/L   Chloride 106 98 - 111 mmol/L   CO2 22 22 - 32 mmol/L   Glucose, Bld 85 70 - 99 mg/dL    Comment: Glucose reference range applies only to samples taken after fasting for at least 8 hours.   BUN 15 6 - 20 mg/dL   Creatinine, Ser 9.23 0.44 - 1.00 mg/dL   Calcium 9.5 8.9 - 89.6 mg/dL   GFR, Estimated >39 >39 mL/min    Comment: (NOTE) Calculated using the CKD-EPI Creatinine Equation (2021)    Anion gap  13 5 - 15    Comment: Performed at [facility], [address]  [place], [place] [address]  CBC     Status: Abnormal   Collection Time: 08/04/23  7:53 PM  Result Value Ref Range   WBC 12.1 (H) 4.0 - 10.5 K/uL   RBC 4.78 3.87 - 5.11 MIL/uL   Hemoglobin 14.0 12.0 - 15.0 g/dL   HCT 58.6 63.9 - 53.9 %   MCV 86.4 80.0 - 100.0 fL   MCH 29.3 26.0 - 34.0 pg   MCHC 33.9 30.0 - 36.0 g/dL   RDW 86.7 88.4 - 84.4 %   Platelets 350 150 - 400 K/uL   nRBC 0.0 0.0 - 0.2 %    Comment: Performed at [facility], [address] [place], [place] [address]  Troponin T, High Sensitivity     Status: None   Collection Time: 08/04/23  7:53 PM  Result Value Ref Range   Troponin T High Sensitivity <15 <19 ng/L    Comment: (NOTE) Biotin concentrations > 1000 ng/mL falsely decrease TnT results.  Serial cardiac troponin measurements are suggested.  Refer to the Links section for chest pain algorithms and additional  guidance. Performed at [facility], [address] [place], [place] [address]   Pregnancy, urine     Status: None   Collection Time: 08/04/23  7:53 PM  Result Value Ref Range   Preg Test, Ur NEGATIVE NEGATIVE    Comment:        THE SENSITIVITY OF THIS METHODOLOGY IS >20 mIU/mL. Performed at [facility], [address] [place], [place] [address]   Resp panel by RT-PCR (RSV, Flu A&B, Covid) Anterior Nasal Swab     Status: None   Collection Time: 08/04/23  7:57 PM   Specimen: Anterior Nasal Swab  Result Value Ref Range   SARS Coronavirus 2 by RT PCR NEGATIVE NEGATIVE    Comment: (NOTE) SARS-CoV-2 target nucleic acids are NOT DETECTED.  The SARS-CoV-2 RNA is generally detectable in upper respiratory specimens during the acute phase of infection. The lowest concentration of SARS-CoV-2 viral copies this assay can detect is 138 copies/mL. A negative result does not preclude SARS-Cov-2 infection and should not be used as the sole basis for treatment or other patient management decisions. A negative result may occur with   improper specimen collection/handling, submission of specimen other than nasopharyngeal swab, presence of viral mutation(s) within the areas targeted by this assay, and inadequate number of viral copies(<138 copies/mL). A negative result must be combined with clinical observations, patient history, and epidemiological information. The expected result is Negative.  Fact Sheet for Patients:  BloggerCourse.com  Fact Sheet for Healthcare Providers:  SeriousBroker.it  This test is no t yet approved or cleared by the [place]  FDA and  has been authorized for detection and/or diagnosis of SARS-CoV-2 by FDA under an Emergency Use Authorization (EUA). This EUA will remain  in effect (meaning this test can be used) for the duration of the COVID-19 declaration under Section 564(b)(1) of the Act, 21 U.S.C.section 360bbb-3(b)(1), unless the authorization is terminated  or revoked sooner.       Influenza A by PCR NEGATIVE NEGATIVE   Influenza B by PCR NEGATIVE NEGATIVE    Comment: (NOTE) The Xpert Xpress SARS-CoV-2/FLU/RSV plus assay is intended as an aid in the diagnosis of influenza from Nasopharyngeal swab specimens and should not be used as a sole basis for  treatment. Nasal washings and aspirates are unacceptable for Xpert Xpress SARS-CoV-2/FLU/RSV testing.  Fact Sheet for Patients: BloggerCourse.com  Fact Sheet for Healthcare Providers: SeriousBroker.it  This test is not yet approved or cleared by the [place]  FDA and has been authorized for detection and/or diagnosis of SARS-CoV-2 by FDA under an Emergency Use Authorization (EUA). This EUA will remain in effect (meaning this test can be used) for the duration of the COVID-19 declaration under Section 564(b)(1) of the Act, 21 U.S.C. section 360bbb-3(b)(1), unless the authorization is terminated or revoked.     Resp  Syncytial Virus by PCR NEGATIVE NEGATIVE    Comment: (NOTE) Fact Sheet for Patients: BloggerCourse.com  Fact Sheet for Healthcare Providers: SeriousBroker.it  This test is not yet approved or cleared by the [place]  FDA and has been authorized for detection and/or diagnosis of SARS-CoV-2 by FDA under an Emergency Use Authorization (EUA). This EUA will remain in effect (meaning this test can be used) for the duration of the COVID-19 declaration under Section 564(b)(1) of the Act, 21 U.S.C. section 360bbb-3(b)(1), unless the authorization is terminated or revoked.  Performed at [facility], [address] [place], [place] [address]      Assessment and Plan :   PDMP not reviewed this encounter.  1. Frontal headache    Suspect tension headache, likely due to dehydration and work stressors.  Recommended general supportive care.  No signs of an acute encephalopathy.  Will defer ER visit.  Counseled patient on potential for adverse effects with medications prescribed/recommended today, ER and return-to-clinic precautions discussed, patient verbalized understanding.    Christopher Savannah, PA-C 08/08/23 1005

## 2023-08-08 NOTE — ED Triage Notes (Signed)
 Pt c/o HA and numbness in my head started yesterday-NAD-steady gait

## 2023-08-22 ENCOUNTER — Encounter: Payer: Self-pay | Admitting: Nurse Practitioner

## 2023-08-22 ENCOUNTER — Other Ambulatory Visit: Payer: Self-pay | Admitting: Nurse Practitioner

## 2023-08-22 ENCOUNTER — Telehealth (INDEPENDENT_AMBULATORY_CARE_PROVIDER_SITE_OTHER): Admitting: Nurse Practitioner

## 2023-08-22 VITALS — Ht 64.0 in | Wt 211.0 lb

## 2023-08-22 DIAGNOSIS — N644 Mastodynia: Secondary | ICD-10-CM | POA: Diagnosis not present

## 2023-08-22 NOTE — Progress Notes (Signed)
 [facility] [address] [place] [address] Dept: [phone] Dept Fax: [phone]  Virtual Video Visit  I connected with Keitha Pinal on 08/22/23 at 10:40 AM EDT by a video enabled telemedicine application and verified that I am speaking with the correct person using two identifiers.  Location patient: Home Location provider: Clinic Persons participating in the virtual visit: Patient; Rachael Harada, NP; Laymon Gladis Sharps, CMA  I discussed the limitations of evaluation and management by telemedicine and the availability of in person appointments. The patient expressed understanding and agreed to proceed.  Chief Complaint  Patient presents with   Breast Pain    Bilateral breast pain that started yesterday    SUBJECTIVE:  HPI:   Discussed the use of AI scribe software for clinical note transcription with the patient, who gave verbal consent to proceed.  History of Present Illness   Rachael Hahn is a 28 year old female who presents with bilateral breast pain and cramping after her menstrual cycle.  She experiences throbbing and aching pain in both breasts, which started on the right side and involved both sides as of yesterday. The breast pain is accompanied by cramping, occurring after her menstrual cycle ended on July 9th. She does not keep regular track of her menstrual cycle but has recently started doing so. There are no lumps, rashes, or nipple discharge. She previously used Depo-Provera for several years but is not currently on any birth control or hormone therapy. She has not taken any medications or used warm compresses for the pain.      Patient Active Problem List   Diagnosis Date Noted   Routine general medical examination at a health care facility 05/05/2023   Numbness and tingling 05/05/2023   Dry skin 05/05/2023   Vaginal irritation 02/23/2023   Blurry vision 12/29/2022   Palpitation  11/02/2022   Primary hypertension 06/08/2022   Chronic migraine w/o aura w/o status migrainosus, not intractable 06/08/2022   Vitamin D  deficiency 06/08/2022   Obesity (BMI 30-39.9) 06/08/2022    History reviewed. No pertinent surgical history.  Family History  Problem Relation Age of Onset   Hypertension Mother        off medication   Hypertension Father    Diabetes Maternal Aunt    Hypertension Maternal Aunt     Social History   Tobacco Use   Smoking status: Never   Smokeless tobacco: Never  Vaping Use   Vaping status: Never Used  Substance Use Topics   Alcohol use: Yes    Comment: occasionally   Drug use: Not Currently     Current Outpatient Medications:    acetaminophen  (TYLENOL ) 650 MG CR tablet, Take 650 mg by mouth every 8 (eight) hours as needed for pain., Disp: , Rfl:    albuterol  (VENTOLIN  HFA) 108 (90 Base) MCG/ACT inhaler, Inhale 1-2 puffs into the lungs every 6 (six) hours as needed for wheezing or shortness of breath., Disp: 18 g, Rfl: 0   amLODipine  (NORVASC ) 5 MG tablet, TAKE 1 TABLET(5 MG) BY MOUTH DAILY, Disp: 90 tablet, Rfl: 0   aspirin-acetaminophen -caffeine (EXCEDRIN  MIGRAINE) 250-250-65 MG tablet, Take 1 tablet by mouth every 8 (eight) hours as needed for headache., Disp: 30 tablet, Rfl: 0   topiramate  (TOPAMAX ) 50 MG tablet, Take 2 tablets (100 mg total) by mouth at bedtime., Disp: 60 tablet, Rfl: 11   clobetasol  cream (TEMOVATE ) 0.05 %, Apply 1 Application topically 2 (two) times daily. (Patient not taking: Reported on  08/22/2023), Disp: 30 g, Rfl: 0   HYDROcodone  bit-homatropine (HYCODAN) 5-1.5 MG/5ML syrup, Take 5 mLs by mouth every 6 (six) hours as needed for cough. (Patient not taking: Reported on 08/22/2023), Disp: 120 mL, Rfl: 0   metoCLOPramide  (REGLAN ) 10 MG tablet, Take 1 tablet (10 mg total) by mouth every 6 (six) hours. (Patient not taking: Reported on 08/22/2023), Disp: 15 tablet, Rfl: 0   naproxen  (NAPROSYN ) 500 MG tablet, TAKE 1 TABLET(500  MG) BY MOUTH TWICE DAILY WITH A MEAL (Patient not taking: Reported on 08/22/2023), Disp: 30 tablet, Rfl: 1   NURTEC 75 MG TBDP, Take by mouth. (Patient not taking: Reported on 08/22/2023), Disp: , Rfl:    predniSONE  (STERAPRED UNI-PAK 21 TAB) 10 MG (21) TBPK tablet, Take by mouth daily. Take 6 tabs by mouth daily  for 2 days, then 5 tabs for 2 days, then 4 tabs for 2 days, then 3 tabs for 2 days, 2 tabs for 2 days, then 1 tab by mouth daily for 2 days (Patient not taking: Reported on 08/22/2023), Disp: 42 tablet, Rfl: 0   Vitamin D , Ergocalciferol , (DRISDOL ) 1.25 MG (50000 UNIT) CAPS capsule, Take 1 capsule (50,000 Units total) by mouth every 7 (seven) days. (Patient not taking: Reported on 08/22/2023), Disp: 12 capsule, Rfl: 0  Allergies  Allergen Reactions   Amoxicillin Other (See Comments)    Patient states yeast infection    Penicillin G     Other Reaction(s): yeast inf    ROS: See pertinent positives and negatives per HPI.  OBSERVATIONS/OBJECTIVE:  VITALS per patient if applicable: Today's Vitals   08/22/23 1040  Weight: 211 lb (95.7 kg)  Height: 5' 4 (1.626 m)   Body mass index is 36.22 kg/m.    GENERAL: Alert and oriented. Appears well and in no acute distress.  HEENT: Atraumatic. Conjunctiva clear. No obvious abnormalities on inspection of external nose and ears.  NECK: Normal movements of the head and neck.  LUNGS: On inspection, no signs of respiratory distress. Breathing rate appears normal. No obvious gross SOB, gasping or wheezing, and no conversational dyspnea.  CV: No obvious cyanosis.  MS: Moves all visible extremities without noticeable abnormality.  PSYCH/NEURO: Pleasant and cooperative. No obvious depression or anxiety. Speech and thought processing grossly intact.  ASSESSMENT AND PLAN:  Bilateral breast pain   She experiences throbbing and aching pain in both breasts, likely due to hormonal fluctuations related to her menstrual cycle or ovulation. There  are no lumps, rashes, or nipple discharge. Order a mammogram and possible breast ultrasound to evaluate for underlying pathology. Advise naproxen  for pain management as needed. Recommend warm compresses and wearing a sports bra for support and pain relief.       I discussed the assessment and treatment plan with the patient. The patient was provided an opportunity to ask questions and all were answered. The patient agreed with the plan and demonstrated an understanding of the instructions.   The patient was advised to call back or seek an in-person evaluation if the symptoms worsen or if the condition fails to improve as anticipated.   Rachael DELENA Harada, NP

## 2023-08-22 NOTE — Patient Instructions (Signed)
 It was great to see you!  I have ordered a mammogram and ultrasound of your breasts, they will call to schedule   You can take naproxen , use heat/ice, and wear sports bra for compression   Let's follow-up if symptoms worsen or don't improve.   Take care,  Tinnie Harada, NP

## 2023-09-13 ENCOUNTER — Ambulatory Visit
Admission: RE | Admit: 2023-09-13 | Discharge: 2023-09-13 | Disposition: A | Discharge status: Home / Self Care | Source: Ambulatory Visit | Attending: Nurse Practitioner | Admitting: Nurse Practitioner

## 2023-09-13 ENCOUNTER — Ambulatory Visit: Payer: Self-pay | Admitting: Nurse Practitioner

## 2023-09-13 DIAGNOSIS — N644 Mastodynia: Secondary | ICD-10-CM

## 2023-09-25 ENCOUNTER — Telehealth (INDEPENDENT_AMBULATORY_CARE_PROVIDER_SITE_OTHER): Admitting: Nurse Practitioner

## 2023-09-25 ENCOUNTER — Encounter: Payer: Self-pay | Admitting: Nurse Practitioner

## 2023-09-25 VITALS — Ht 64.0 in | Wt 211.0 lb

## 2023-09-25 DIAGNOSIS — I10 Essential (primary) hypertension: Secondary | ICD-10-CM | POA: Diagnosis not present

## 2023-09-25 DIAGNOSIS — Z6836 Body mass index (BMI) 36.0-36.9, adult: Secondary | ICD-10-CM | POA: Diagnosis not present

## 2023-09-25 DIAGNOSIS — E669 Obesity, unspecified: Secondary | ICD-10-CM

## 2023-09-25 NOTE — Assessment & Plan Note (Signed)
 Chronic, stable   Well-controlled without medication

## 2023-09-25 NOTE — Assessment & Plan Note (Signed)
 BMI 36.2. She has experienced recent weight gain and is interested in medication for weight management. Discussed using Contrave, noting potential side effects like nausea and jitteriness, which can be reduced by taking it with food. Start 1 tablet daily and will titrate as per dosing. Advise taking it with food to minimize nausea. Encourage regular exercise, mindful eating, and ensure adequate hydration. Follow up in four weeks to assess progress.

## 2023-09-25 NOTE — Patient Instructions (Signed)
 It was great to see you!  Start contrave 1 tablet daily for 7 days, then 1 tablet twice a day for 7 days, then 2 tablet in the morning and 1 in the afternoon for 7 days, then 2 tablets daily.   Take this with food.   Slowly increase exercise and start incorporating more fruits and veggies in your diet.   Let's follow-up in 4 weeks, sooner if you have concerns.  If a referral was placed today, you will be contacted for an appointment. Please note that routine referrals can sometimes take up to 3-4 weeks to process. Please call our office if you haven't heard anything after this time frame.  Take care,  Tinnie Harada, NP

## 2023-09-25 NOTE — Progress Notes (Signed)
 [facility] [address] [place] [address] Dept: [phone] Dept Fax: [phone]  Virtual Video Visit  I connected with Rachael Hahn on 09/25/23 at 10:20 AM EDT by a video enabled telemedicine application and verified that I am speaking with the correct person using two identifiers.  Location patient: Home Location provider: Clinic Persons participating in the virtual visit: Patient; Tinnie Harada, NP; Laymon Gladis Sharps, CMA  I discussed the limitations of evaluation and management by telemedicine and the availability of in person appointments. The patient expressed understanding and agreed to proceed.  Chief Complaint  Patient presents with   Weight Management    Wants to discuss weight loss options    SUBJECTIVE:  HPI:   Discussed the use of AI scribe software for clinical note transcription with the patient, who gave verbal consent to proceed.  History of Present Illness   Rachael Hahn is a 28 year old female who presents with concerns about weight management.  She previously lost weight through exercise but has since regained it. She is exploring weight management options, including medication. She is unsure if her insurance covers weight loss medications and prefers oral medications over injections. She notes that she has stopped exercising, but will start back up again.     Patient Active Problem List   Diagnosis Date Noted   Routine general medical examination at a health care facility 05/05/2023   Numbness and tingling 05/05/2023   Dry skin 05/05/2023   Vaginal irritation 02/23/2023   Blurry vision 12/29/2022   Palpitation 11/02/2022   Primary hypertension 06/08/2022   Chronic migraine w/o aura w/o status migrainosus, not intractable 06/08/2022   Vitamin D  deficiency 06/08/2022   Obesity (BMI 30-39.9) 06/08/2022    History reviewed. No pertinent surgical history.  Family History   Problem Relation Age of Onset   Hypertension Mother        off medication   Hypertension Father    Diabetes Maternal Aunt    Hypertension Maternal Aunt     Social History   Tobacco Use   Smoking status: Never   Smokeless tobacco: Never  Vaping Use   Vaping status: Never Used  Substance Use Topics   Alcohol use: Yes    Comment: occasionally   Drug use: Not Currently     Current Outpatient Medications:    acetaminophen  (TYLENOL ) 650 MG CR tablet, Take 650 mg by mouth every 8 (eight) hours as needed for pain., Disp: , Rfl:    albuterol  (VENTOLIN  HFA) 108 (90 Base) MCG/ACT inhaler, Inhale 1-2 puffs into the lungs every 6 (six) hours as needed for wheezing or shortness of breath., Disp: 18 g, Rfl: 0   amLODipine  (NORVASC ) 5 MG tablet, TAKE 1 TABLET(5 MG) BY MOUTH DAILY, Disp: 90 tablet, Rfl: 0   aspirin-acetaminophen -caffeine (EXCEDRIN  MIGRAINE) 250-250-65 MG tablet, Take 1 tablet by mouth every 8 (eight) hours as needed for headache., Disp: 30 tablet, Rfl: 0   topiramate  (TOPAMAX ) 50 MG tablet, Take 2 tablets (100 mg total) by mouth at bedtime., Disp: 60 tablet, Rfl: 11   clobetasol  cream (TEMOVATE ) 0.05 %, Apply 1 Application topically 2 (two) times daily. (Patient not taking: Reported on 09/25/2023), Disp: 30 g, Rfl: 0   naproxen  (NAPROSYN ) 500 MG tablet, TAKE 1 TABLET(500 MG) BY MOUTH TWICE DAILY WITH A MEAL (Patient not taking: Reported on 09/25/2023), Disp: 30 tablet, Rfl: 1   NURTEC 75 MG TBDP, Take by mouth. (Patient not taking: Reported on 09/25/2023), Disp: ,  Rfl:   Allergies  Allergen Reactions   Amoxicillin Other (See Comments)    Patient states yeast infection    Penicillin G     Other Reaction(s): yeast inf    ROS: See pertinent positives and negatives per HPI.  OBSERVATIONS/OBJECTIVE:  VITALS per patient if applicable: Today's Vitals   09/25/23 1012  Weight: 211 lb (95.7 kg)  Height: 5' 4 (1.626 m)   Body mass index is 36.22 kg/m.    GENERAL: Alert  and oriented. Appears well and in no acute distress.  HEENT: Atraumatic. Conjunctiva clear. No obvious abnormalities on inspection of external nose and ears.  NECK: Normal movements of the head and neck.  LUNGS: On inspection, no signs of respiratory distress. Breathing rate appears normal. No obvious gross SOB, gasping or wheezing, and no conversational dyspnea.  CV: No obvious cyanosis.  MS: Moves all visible extremities without noticeable abnormality.  PSYCH/NEURO: Pleasant and cooperative. No obvious depression or anxiety. Speech and thought processing grossly intact.  ASSESSMENT AND PLAN:  Problem List Items Addressed This Visit       Cardiovascular and Mediastinum   Primary hypertension - Primary   Chronic, stable.  Well controlled without medication.         Other   Obesity (BMI 30-39.9)   BMI 36.2. She has experienced recent weight gain and is interested in medication for weight management. Discussed using Contrave, noting potential side effects like nausea and jitteriness, which can be reduced by taking it with food. Start 1 tablet daily and will titrate as per dosing. Advise taking it with food to minimize nausea. Encourage regular exercise, mindful eating, and ensure adequate hydration. Follow up in four weeks to assess progress.         I discussed the assessment and treatment plan with the patient. The patient was provided an opportunity to ask questions and all were answered. The patient agreed with the plan and demonstrated an understanding of the instructions.   The patient was advised to call back or seek an in-person evaluation if the symptoms worsen or if the condition fails to improve as anticipated.   [name], NP

## 2023-10-10 ENCOUNTER — Telehealth: Payer: Self-pay

## 2023-10-10 ENCOUNTER — Other Ambulatory Visit: Payer: Self-pay

## 2023-10-10 ENCOUNTER — Ambulatory Visit
Admission: EM | Admit: 2023-10-10 | Discharge: 2023-10-10 | Disposition: A | Discharge status: Home / Self Care | Attending: Family Medicine | Admitting: Family Medicine

## 2023-10-10 DIAGNOSIS — R519 Headache, unspecified: Secondary | ICD-10-CM | POA: Diagnosis not present

## 2023-10-10 MED ORDER — ASPIRIN-ACETAMINOPHEN-CAFFEINE 250-250-65 MG PO TABS: 1.0000 | ORAL_TABLET | Freq: Every day | ORAL | 0 refills | Status: AC | PRN

## 2023-10-10 MED ORDER — KETOROLAC TROMETHAMINE 30 MG/ML IJ SOLN
30.0000 mg | Freq: Once | INTRAMUSCULAR | Status: AC
  Administered 2023-10-10: 30 mg via INTRAMUSCULAR

## 2023-10-10 NOTE — ED Triage Notes (Signed)
 Pt c/o Ha and nauseax2-3wks. Pt denies vomiting or vision changes

## 2023-10-10 NOTE — Telephone Encounter (Signed)
 Left message for patient to return call.    Patient had a mychart video visit on 09/25/23 and contrave  was mentioned to be prescribed. Can this be sent to pharmacy.

## 2023-10-10 NOTE — Discharge Instructions (Signed)
 You were given a Toradol  injection in clinic today. Do not take any over the counter NSAID's such as Advil , ibuprofen , Aleve , or naproxen  for 24 hours. You may take tylenol  if needed.   a prescription for Excedrin  Migraine has been sent to the pharmacy.  Please keep a headache diary and take to your neurologist for further evaluation and treatment of your migraine/headaches.  Please go to the ER if you develop any worsening symptoms.  Hope you feel better soon!

## 2023-10-10 NOTE — ED Provider Notes (Signed)
 [facility]    CSN: [id number] Arrival date & time: 10/10/23  1001      History   Chief Complaint No chief complaint on file.   HPI Lunabella Badgett is a 28 y.o. female with a past medical history of hypertension, migraines, depression presents for headache.  Patient reports 2 weeks of an intermittent posterior headache that does not radiate.  States it is a throbbing type headache it is associated with nausea.  Currently rates her headache as an 8 out of 10 and denies worst headache of life.  Denies any vomiting, dizziness, syncope, visual changes, unilateral weakness.  States this is a different presentation than her typical migraines.  She does take Topamax  daily.  She denies thunderclap headache or first-degree relative with history of SAH.  States she has had a normal head CT in April of this year.  She has been taking Tylenol  for symptoms.  No other concerns at this time  HPI  Past Medical History:  Diagnosis Date   Arthritis    Depression    Hypertension    Migraine    Trichomoniasis     Patient Active Problem List   Diagnosis Date Noted   Routine general medical examination at a health care facility 05/05/2023   Numbness and tingling 05/05/2023   Dry skin 05/05/2023   Vaginal irritation 02/23/2023   Blurry vision 12/29/2022   Palpitation 11/02/2022   Primary hypertension 06/08/2022   Chronic migraine w/o aura w/o status migrainosus, not intractable 06/08/2022   Vitamin D  deficiency 06/08/2022   Obesity (BMI 30-39.9) 06/08/2022    History reviewed. No pertinent surgical history.  OB History   No obstetric history on file.      Home Medications    Prior to Admission medications   Medication Sig Start Date End Date Taking? Authorizing Provider  aspirin -acetaminophen -caffeine  (EXCEDRIN  MIGRAINE) 250-250-65 MG tablet Take 1 tablet by mouth daily as needed for headache or migraine. 10/10/23  Yes Amare Kontos, Jodi R, NP  albuterol  (VENTOLIN  HFA) 108 (90 Base)  MCG/ACT inhaler Inhale 1-2 puffs into the lungs every 6 (six) hours as needed for wheezing or shortness of breath. 07/26/23   Merilee Andrea CROME, NP  amLODipine  (NORVASC ) 5 MG tablet TAKE 1 TABLET(5 MG) BY MOUTH DAILY 07/13/23   McElwee, Lauren A, NP  clobetasol  cream (TEMOVATE ) 0.05 % Apply 1 Application topically 2 (two) times daily. Patient not taking: Reported on 09/25/2023 02/23/23   McElwee, Tinnie A, NP  naproxen  (NAPROSYN ) 500 MG tablet TAKE 1 TABLET(500 MG) BY MOUTH TWICE DAILY WITH A MEAL Patient not taking: Reported on 09/25/2023 06/08/23   McElwee, Lauren A, NP  NURTEC 75 MG TBDP Take by mouth. Patient not taking: Reported on 09/25/2023 12/22/22   [provider]  topiramate  (TOPAMAX ) 50 MG tablet Take 2 tablets (100 mg total) by mouth at bedtime. 05/24/23   McElwee, Tinnie LABOR, NP    Family History Family History  Problem Relation Age of Onset   Hypertension Mother        off medication   Hypertension Father    Diabetes Maternal Aunt    Hypertension Maternal Aunt     Social History Social History   Tobacco Use   Smoking status: Never   Smokeless tobacco: Never  Vaping Use   Vaping status: Never Used  Substance Use Topics   Alcohol use: Yes    Comment: occasionally   Drug use: Not Currently     Allergies   Amoxicillin and Penicillin g  Review of Systems Review of Systems  Gastrointestinal:  Positive for nausea.  Neurological:  Positive for headaches.     Physical Exam Triage Vital Signs ED Triage Vitals  Encounter Vitals Group     BP 10/10/23 1049 117/86     Girls Systolic BP Percentile --      Girls Diastolic BP Percentile --      Boys Systolic BP Percentile --      Boys Diastolic BP Percentile --      Pulse Rate 10/10/23 1049 63     Resp 10/10/23 1049 17     Temp 10/10/23 1049 98.3 F (36.8 C)     Temp Source 10/10/23 1049 Oral     SpO2 10/10/23 1049 98 %     Weight --      Height --      Head Circumference --      Peak Flow --      Pain  Score 10/10/23 1047 8     Pain Loc --      Pain Education --      Exclude from Growth Chart --    No data found.  Updated Vital Signs BP 117/86   Pulse 63   Temp 98.3 F (36.8 C) (Oral)   Resp 17   LMP 10/03/2023 (Exact Date)   SpO2 98%   Visual Acuity Right Eye Distance:   Left Eye Distance:   Bilateral Distance:    Right Eye Near:   Left Eye Near:    Bilateral Near:     Physical Exam Vitals and nursing note reviewed.  Constitutional:      General: She is not in acute distress.    Appearance: Normal appearance. She is not ill-appearing.  HENT:     Head: Normocephalic and atraumatic.  Eyes:     Pupils: Pupils are equal, round, and reactive to light.  Cardiovascular:     Rate and Rhythm: Normal rate.  Pulmonary:     Effort: Pulmonary effort is normal.  Skin:    General: Skin is warm and dry.  Neurological:     General: No focal deficit present.     Mental Status: She is alert.     GCS: GCS eye subscore is 4. GCS verbal subscore is 5. GCS motor subscore is 6.     Cranial Nerves: No facial asymmetry.     Motor: No weakness or pronator drift.     Coordination: Romberg sign negative.     Gait: Tandem walk normal.     Comments: Strength is 5 out of 5 bilateral upper extremities  Psychiatric:        Mood and Affect: Mood normal.        Behavior: Behavior normal.      UC Treatments / Results  Labs (all labs ordered are listed, but only abnormal results are displayed) Labs Reviewed - No data to display  Basic metabolic panel Order: [id number]  Status: Final result     Next appt: 10/11/2023 at 11:00 AM in Family Medicine ([name], NP)   Test Result Released: Yes (seen)   0 Result Notes          Component Ref Range & Units (hover) 2 mo ago (08/04/23) 4 mo ago (05/23/23) 5 mo ago (05/05/23) 11 mo ago (11/02/22) 1 yr ago (09/21/22) 1 yr ago (03/01/22) 2 yr ago (05/27/21)  Sodium 141 138 139 R 138 R 136 R 136 140 R  Potassium 3.6 4.1 3.7 R 3.6  R 3.6 R  4.0 4.3 R  Chloride 106 109 108 R 102 R 99 R 111 105 R  CO2 22 20 Low  22 R 28 R 28 R 15 Low  19 Low  R  Glucose, Bld 85 79 CM 63 Low  77 68 Low  86 CM 63 Low   Comment: Glucose reference range applies only to samples taken after fasting for at least 8 hours.  BUN 15 17 11  R 17 R 16 R 14 12  Creatinine, Ser 0.76 0.77 0.66 R 0.81 R 0.64 R 0.66 0.59 R  Calcium 9.5 9.4 9.3 R 9.5 R 9.7 R 8.7 Low  9.6 R  GFR, Estimated >60 >60 CM    >60 CM   Comment: (NOTE) Calculated using the CKD-EPI Creatinine Equation (2021)  Anion gap 13 9 CM    10 CM   Comment: Performed at [facility] ([place]), [address] [place], [place] [address]  Resulting Agency [facility] CL[facility] [place] [facility] [place] [facility]        Specimen Collected: 08/04/23 19:53 Last Resulted: 08/04/23 20:26    EKG   Radiology No results found.  Procedures Procedures (including critical care time)  Medications Ordered in UC Medications  ketorolac  (TORADOL ) 30 MG/ML injection 30 mg (30 mg Intramuscular Given 10/10/23 1215)    Initial Impression / Assessment and Plan / UC Course  I have reviewed the triage vital signs and the nursing notes.  Pertinent labs & imaging results that were available during my care of the patient were reviewed by me and considered in my medical decision making (see chart for details).     Reviewed exam and symptoms with patient.  No red flags.  Patient presenting with headache and denies worst headache of life.  Neurological exam is reassuring.  Patient given Toradol  injection in clinic.  She was instructed no NSAIDs for 24 hours and verbalized understanding.  Reports improvement in headache after treatment.  Excedrin  Migraine sent to pharmacy.  Advised to keep headache diary and take to her neurologist for further evaluation and treatment of her headaches.  Strict ER precautions reviewed and patient verbalized understanding. Final Clinical Impressions(s)  / UC Diagnoses   Final diagnoses:  Acute nonintractable headache, unspecified headache type     Discharge Instructions      You were given a Toradol  injection in clinic today. Do not take any over the counter NSAID's such as Advil , ibuprofen , Aleve , or naproxen  for 24 hours. You may take tylenol  if needed.   a prescription for Excedrin  Migraine has been sent to the pharmacy.  Please keep a headache diary and take to your neurologist for further evaluation and treatment of your migraine/headaches.  Please go to the ER if you develop any worsening symptoms.  Hope you feel better soon!      ED Prescriptions     Medication Sig Dispense Auth. Provider   aspirin -acetaminophen -caffeine  (EXCEDRIN  MIGRAINE) 250-250-65 MG tablet Take 1 tablet by mouth daily as needed for headache or migraine. 15 tablet Kaile Bixler, Jodi R, NP      PDMP not reviewed this encounter.   Loreda Myla SAUNDERS, NP 10/10/23 1229

## 2023-10-10 NOTE — Telephone Encounter (Signed)
 Copied from CRM #[id number]. Topic: Clinical - Medication Question >> Oct 10, 2023  8:40 AM Thersia BROCKS wrote: Reason for CRM: Patient called in regarding medication, that was waiting for approval, patient would like for NP Tinnie Harada or nurses to give her a callback regarding this, stated it was for weight loss , contrave 

## 2023-10-11 ENCOUNTER — Telehealth (INDEPENDENT_AMBULATORY_CARE_PROVIDER_SITE_OTHER): Admitting: Nurse Practitioner

## 2023-10-11 ENCOUNTER — Other Ambulatory Visit (HOSPITAL_COMMUNITY): Payer: Self-pay

## 2023-10-11 ENCOUNTER — Telehealth: Payer: Self-pay

## 2023-10-11 ENCOUNTER — Encounter: Payer: Self-pay | Admitting: Nurse Practitioner

## 2023-10-11 VITALS — Ht 64.0 in | Wt 214.0 lb

## 2023-10-11 DIAGNOSIS — E669 Obesity, unspecified: Secondary | ICD-10-CM | POA: Diagnosis not present

## 2023-10-11 DIAGNOSIS — G43709 Chronic migraine without aura, not intractable, without status migrainosus: Secondary | ICD-10-CM

## 2023-10-11 DIAGNOSIS — Z6836 Body mass index (BMI) 36.0-36.9, adult: Secondary | ICD-10-CM | POA: Diagnosis not present

## 2023-10-11 MED ORDER — NALTREXONE-BUPROPION HCL ER 8-90 MG PO TB12: ORAL_TABLET | ORAL | 0 refills | Status: AC

## 2023-10-11 MED ORDER — UBRELVY 100 MG PO TABS: 100.0000 mg | ORAL_TABLET | Freq: Every day | ORAL | 0 refills | Status: DC | PRN

## 2023-10-11 NOTE — Patient Instructions (Signed)
 It was great to see you!  Start ubrelvy  1 tablet at the first sign of a headache daily as needed  Switch topamax  to 1 pill twice a day   I am following up on the contrave  for weight loss.   Let's follow-up in 6 weeks, sooner if you have concerns.  If a referral was placed today, you will be contacted for an appointment. Please note that routine referrals can sometimes take up to 3-4 weeks to process. Please call our office if you haven't heard anything after this time frame.  Take care,  Tinnie Harada, NP

## 2023-10-11 NOTE — Assessment & Plan Note (Addendum)
 Chronic, not controlled. She experiences chronic headache syndrome with an occipital burning sensation. A recent exacerbation required urgent care and a Toradol  injection. Topamax  is taken at bedtime, with a possible dosing adjustment to 50mg , one tablet in the morning and one at night or two tablets at bedtime, depending on tolerance and efficacy. Nurtec was discontinued due to adverse effects. Ubrelvy  is being considered for acute management. Prescribe Ubrelvy  100mg  for acute headache management, to be taken once daily as needed at the first sign of a headache. Advise taking Ubrelvy  for the first time when not at work to assess tolerance. Provide a coupon card for Ubrelvy  to assist with cost. Follow-up in 4-6 weeks.

## 2023-10-11 NOTE — Telephone Encounter (Signed)
 Can we get a prior auth on Contrave  ER 8-90mg ?

## 2023-10-11 NOTE — Telephone Encounter (Signed)
 Discussed during visit today.

## 2023-10-11 NOTE — Progress Notes (Signed)
 [facility]IMARY [facility]ARE [facility] [address] [place] [address] Dept: [phone] Dept Fax: [phone]  Virtual Video Visit  I connected with Keitha Pinal on 10/11/23 at 11:00 AM EDT by a video enabled telemedicine application and verified that I am speaking with the correct person using two identifiers.  Location patient: Home Location provider: Clinic Persons participating in the virtual visit: Patient; Tinnie Harada, NP; Laymon Gladis Sharps, CMA  I discussed the limitations of evaluation and management by telemedicine and the availability of in person appointments. The patient expressed understanding and agreed to proceed.  Chief Complaint  Patient presents with   Headache    Went to urgent care yesterday, concerns with having cluster headaches    SUBJECTIVE:  HPI:  Discussed the use of AI scribe software for clinical note transcription with the patient, who gave verbal consent to proceed.  History of Present Illness   Rachael Hahn is a 28 year old female who presents with a burning headache in the back of her head.  She experiences a burning sensation in the middle of the back of her head, described as 'a scab burning.' This differs from her previous headache pattern, which was located in front of her head. She visited urgent care yesterday and received a Toradol  injection. The burning pain persists but is less severe. There is no photophobia, nausea, or rash.  In April, a CT scan showed no abnormalities related to her headaches.  She takes Topamax  at bedtime for headache management, opting for one tablet (50mg ) instead of two due to concerns about fatigue from working two jobs. One tablet does not cause tiredness, but two tablets help her sleep through the night. She previously tried Nurtec but discontinued it due to adverse effects. She has also used Excedrin  and sumatriptan .     Patient Active Problem List   Diagnosis  Date Noted   Routine general medical examination at a health care facility 05/05/2023   Numbness and tingling 05/05/2023   Dry skin 05/05/2023   Vaginal irritation 02/23/2023   Blurry vision 12/29/2022   Palpitation 11/02/2022   Primary hypertension 06/08/2022   Chronic migraine w/o aura w/o status migrainosus, not intractable 06/08/2022   Vitamin D  deficiency 06/08/2022   Obesity (BMI 30-39.9) 06/08/2022    History reviewed. No pertinent surgical history.  Family History  Problem Relation Age of Onset   Hypertension Mother        off medication   Hypertension Father    Diabetes Maternal Aunt    Hypertension Maternal Aunt     Social History   Tobacco Use   Smoking status: Never   Smokeless tobacco: Never  Vaping Use   Vaping status: Never Used  Substance Use Topics   Alcohol use: Yes    Comment: occasionally   Drug use: Not Currently     Current Outpatient Medications:    albuterol  (VENTOLIN  HFA) 108 (90 Base) MCG/ACT inhaler, Inhale 1-2 puffs into the lungs every 6 (six) hours as needed for wheezing or shortness of breath., Disp: 18 g, Rfl: 0   amLODipine  (NORVASC ) 5 MG tablet, TAKE 1 TABLET(5 MG) BY MOUTH DAILY, Disp: 90 tablet, Rfl: 0   aspirin -acetaminophen -caffeine  (EXCEDRIN  MIGRAINE) 250-250-65 MG tablet, Take 1 tablet by mouth daily as needed for headache or migraine., Disp: 15 tablet, Rfl: 0   Naltrexone -buPROPion  HCl ER 8-90 MG TB12, Start 1 tablet every morning for 7 days, then 1 tablet twice daily for 7 days, Disp: 120 tablet, Rfl: 0  naproxen  (NAPROSYN ) 500 MG tablet, TAKE 1 TABLET(500 MG) BY MOUTH TWICE DAILY WITH A MEAL, Disp: 30 tablet, Rfl: 1   topiramate  (TOPAMAX ) 50 MG tablet, Take 2 tablets (100 mg total) by mouth at bedtime., Disp: 60 tablet, Rfl: 11   Ubrogepant  (UBRELVY ) 100 MG TABS, Take 1 tablet (100 mg total) by mouth daily as needed (migraine headache)., Disp: 9 tablet, Rfl: 0   clobetasol  cream (TEMOVATE ) 0.05 %, Apply 1 Application topically  2 (two) times daily. (Patient not taking: Reported on 10/11/2023), Disp: 30 g, Rfl: 0  Allergies  Allergen Reactions   Amoxicillin Other (See Comments)    Patient states yeast infection    Penicillin G     Other Reaction(s): yeast inf    ROS: See pertinent positives and negatives per HPI.  OBSERVATIONS/OBJECTIVE:  VITALS per patient if applicable: Today's Vitals   10/11/23 1015  Weight: 214 lb (97.1 kg)  Height: 5' 4 (1.626 m)   Body mass index is 36.73 kg/m.    GENERAL: Alert and oriented. Appears well and in no acute distress.  HEENT: Atraumatic. Conjunctiva clear. No obvious abnormalities on inspection of external nose and ears.  NECK: Normal movements of the head and neck.  LUNGS: On inspection, no signs of respiratory distress. Breathing rate appears normal. No obvious gross SOB, gasping or wheezing, and no conversational dyspnea.  CV: No obvious cyanosis.  MS: Moves all visible extremities without noticeable abnormality.  PSYCH/NEURO: Pleasant and cooperative. No obvious depression or anxiety. Speech and thought processing grossly intact.  ASSESSMENT AND PLAN:  Problem List Items Addressed This Visit       Cardiovascular and Mediastinum   Chronic migraine w/o aura w/o status migrainosus, not intractable - Primary   Chronic, not controlled. She experiences chronic headache syndrome with an occipital burning sensation. A recent exacerbation required urgent care and a Toradol  injection. Topamax  is taken at bedtime, with a possible dosing adjustment to 50mg , one tablet in the morning and one at night or two tablets at bedtime, depending on tolerance and efficacy. Nurtec was discontinued due to adverse effects. Ubrelvy  is being considered for acute management. Prescribe Ubrelvy  100mg  for acute headache management, to be taken once daily as needed at the first sign of a headache. Advise taking Ubrelvy  for the first time when not at work to assess tolerance. Provide a  coupon card for Ubrelvy  to assist with cost. Follow-up in 4-6 weeks.      Relevant Medications   Ubrogepant  (UBRELVY ) 100 MG TABS     Other   Obesity (BMI 30-39.9)   BMI 36.7. Obesity management includes consideration of pharmacotherapy. See prior notes on 09/25/23. Has not received contrave  yet, will follow-up on this.       Relevant Medications   Naltrexone -buPROPion  HCl ER 8-90 MG TB12   I discussed the assessment and treatment plan with the patient. The patient was provided an opportunity to ask questions and all were answered. The patient agreed with the plan and demonstrated an understanding of the instructions.   The patient was advised to call back or seek an in-person evaluation if the symptoms worsen or if the condition fails to improve as anticipated.   [name], NP

## 2023-10-11 NOTE — Assessment & Plan Note (Signed)
 BMI 36.7. Obesity management includes consideration of pharmacotherapy. See prior notes on 09/25/23. Has not received contrave  yet, will follow-up on this.

## 2023-10-11 NOTE — Telephone Encounter (Signed)
 Request:    Pharmacy Patient Advocate Encounter  Insurance verification completed.   The patient is [place] through [employer] [place]   Ran test claim for Contrave  8-90mg . Currently a quantity of 120 is Excluded as the pts plan doesn't cover weightloss/obesity drugs.  This test claim was processed through [facility]- copay amounts may vary at other pharmacies due to pharmacy/plan contracts, or as the patient moves through the different stages of their insurance plan.

## 2023-10-16 ENCOUNTER — Telehealth: Payer: Self-pay

## 2023-10-16 ENCOUNTER — Other Ambulatory Visit (HOSPITAL_COMMUNITY): Payer: Self-pay

## 2023-10-16 NOTE — Telephone Encounter (Signed)
 Pharmacy Patient Advocate Encounter   Received notification from Onbase that prior authorization for Ubrelvy  100MG  tablets  is required/requested.   Insurance verification completed.   The patient is insured through [facility] [place]  .   Per test claim: PA required; PA submitted to above mentioned insurance via Latent Key/confirmation #/EOC [id number] Status is pending

## 2023-10-17 ENCOUNTER — Other Ambulatory Visit (HOSPITAL_COMMUNITY): Payer: Self-pay

## 2023-10-17 NOTE — Telephone Encounter (Signed)
 Pharmacy Patient Advocate Encounter  Received notification from [employer] [place]  that Prior Authorization for Ubrelvy  100MG  tablets  has been APPROVED from 10/17/23 to 10/16/24. Ran test claim, Copay is $0. This test claim was processed through [facility]- copay amounts may vary at other pharmacies due to pharmacy/plan contracts, or as the patient moves through the different stages of their insurance plan.   PA #/Case ID/Reference #: [id number]

## 2023-10-19 ENCOUNTER — Telehealth: Payer: Self-pay | Admitting: Neurology

## 2023-10-19 NOTE — Telephone Encounter (Signed)
 Ps is asking for a call to discuss arm numbness she has had for months and there is now pain in the back of her head for a little over a week, pt believes this is from topiramate  (TOPAMAX ) 50 MG tablet .  Please call pt to discuss.

## 2023-10-19 NOTE — Telephone Encounter (Signed)
 She was referred to eye doctor following last visit in November 2024, was she seen by ophthalmologist yet  Okay to give her follow-up with nurse practitioner only to discuss treatment options, okay with virtual visit

## 2023-10-19 NOTE — Telephone Encounter (Signed)
 Call to patient, she reports since starting the topamax  her tongue has been numb, which was known side effects. In Rachael Hahn 2025, She then began having numbness in her arms and hands and went to the ER, was negative for stroke. She was taking two at the time and reports  numbness in arms and hands still. She reports decreasing to  one 50 mg topamax  daily and the numbness got better but still having headaches so increased back to 2 tablets daily and the numbness returned. She now reports a headaches in the back of her head with a burning sensation. She denies any stroke like symptoms. Advised I would send to Dr. Onita to review and reach out with her recommendations. Patient verbalized understanding

## 2023-10-19 NOTE — Telephone Encounter (Signed)
 Call to patient, no answer. Left message to call back. Mychart message also sent message.

## 2023-10-21 ENCOUNTER — Other Ambulatory Visit: Payer: Self-pay | Admitting: Nurse Practitioner

## 2023-10-21 DIAGNOSIS — I10 Essential (primary) hypertension: Secondary | ICD-10-CM

## 2023-10-23 ENCOUNTER — Other Ambulatory Visit: Payer: Self-pay

## 2023-10-23 ENCOUNTER — Emergency Department (HOSPITAL_COMMUNITY)

## 2023-10-23 ENCOUNTER — Encounter (HOSPITAL_COMMUNITY): Payer: Self-pay

## 2023-10-23 ENCOUNTER — Emergency Department (HOSPITAL_COMMUNITY)
Admission: EM | Admit: 2023-10-23 | Discharge: 2023-10-23 | Disposition: A | Discharge status: Home / Self Care | Attending: Emergency Medicine | Admitting: Emergency Medicine

## 2023-10-23 DIAGNOSIS — R29818 Other symptoms and signs involving the nervous system: Secondary | ICD-10-CM | POA: Diagnosis not present

## 2023-10-23 DIAGNOSIS — R519 Headache, unspecified: Secondary | ICD-10-CM | POA: Insufficient documentation

## 2023-10-23 LAB — COMPREHENSIVE METABOLIC PANEL WITH GFR
ALT: 14 U/L (ref 0–44)
AST: 16 U/L (ref 15–41)
Albumin: 4.3 g/dL (ref 3.5–5.0)
Alkaline Phosphatase: 96 U/L (ref 38–126)
Anion gap: 12 (ref 5–15)
BUN: 13 mg/dL (ref 6–20)
CO2: 18 mmol/L — ABNORMAL LOW (ref 22–32)
Calcium: 9.9 mg/dL (ref 8.9–10.3)
Chloride: 108 mmol/L (ref 98–111)
Creatinine, Ser: 0.71 mg/dL (ref 0.44–1.00)
GFR, Estimated: >60 mL/min (ref >60)
Glucose, Bld: 90 mg/dL (ref 70–99)
Potassium: 4 mmol/L (ref 3.5–5.1)
Sodium: 138 mmol/L (ref 135–145)
Total Bilirubin: 0.3 mg/dL (ref 0.0–1.2)
Total Protein: 7.3 g/dL (ref 6.5–8.1)

## 2023-10-23 LAB — CBC WITH DIFFERENTIAL/PLATELET
Abs Immature Granulocytes: 0.01 K/uL (ref 0.00–0.07)
Basophils Absolute: 0 K/uL (ref 0.0–0.1)
Basophils Relative: 0 %
Eosinophils Absolute: 0.1 K/uL (ref 0.0–0.5)
Eosinophils Relative: 1 %
HCT: 42.1 % (ref 36.0–46.0)
Hemoglobin: 13.2 g/dL (ref 12.0–15.0)
Immature Granulocytes: 0 %
Lymphocytes Relative: 39 %
Lymphs Abs: 2.1 K/uL (ref 0.7–4.0)
MCH: 28.3 pg (ref 26.0–34.0)
MCHC: 31.4 g/dL (ref 30.0–36.0)
MCV: 90.1 fL (ref 80.0–100.0)
Monocytes Absolute: 0.4 K/uL (ref 0.1–1.0)
Monocytes Relative: 7 %
Neutro Abs: 2.9 K/uL (ref 1.7–7.7)
Neutrophils Relative %: 53 %
Platelets: 327 K/uL (ref 150–400)
RBC: 4.67 MIL/uL (ref 3.87–5.11)
RDW: 13.2 % (ref 11.5–15.5)
WBC: 5.5 K/uL (ref 4.0–10.5)
nRBC: 0 % (ref 0.0–0.2)

## 2023-10-23 LAB — HCG, SERUM, QUALITATIVE: Preg, Serum: NEGATIVE

## 2023-10-23 MED ORDER — SODIUM CHLORIDE 0.9 % IV BOLUS
1000.0000 mL | Freq: Once | INTRAVENOUS | Status: DC
Start: 2023-10-23 — End: 2023-10-23

## 2023-10-23 MED ORDER — KETOROLAC TROMETHAMINE 30 MG/ML IJ SOLN
15.0000 mg | Freq: Once | INTRAMUSCULAR | Status: AC
  Administered 2023-10-23: 15 mg via INTRAVENOUS
  Filled 2023-10-23: qty 1

## 2023-10-23 MED ORDER — METHYLPREDNISOLONE 4 MG PO TBPK
ORAL_TABLET | ORAL | 0 refills | Status: DC
Start: 2023-10-23 — End: 2023-11-06

## 2023-10-23 MED ORDER — METOCLOPRAMIDE HCL 5 MG/ML IJ SOLN
10.0000 mg | Freq: Once | INTRAMUSCULAR | Status: DC
Start: 2023-10-23 — End: 2023-10-23
  Filled 2023-10-23: qty 2

## 2023-10-23 MED ORDER — DIPHENHYDRAMINE HCL 50 MG/ML IJ SOLN
12.5000 mg | Freq: Once | INTRAMUSCULAR | Status: DC
Start: 2023-10-23 — End: 2023-10-23
  Filled 2023-10-23: qty 1

## 2023-10-23 MED ORDER — IOHEXOL 350 MG/ML SOLN
75.0000 mL | Freq: Once | INTRAVENOUS | Status: AC | PRN
  Administered 2023-10-23: 75 mL via INTRAVENOUS

## 2023-10-23 NOTE — ED Notes (Signed)
 Discharge instructions, medications, and follow up care reviewed with and provided to pt. Pt denies any further questions, and has verbalized understanding.

## 2023-10-23 NOTE — ED Provider Notes (Signed)
 [place] EMERGENCY DEPARTMENT AT [facility] Provider Note   CSN: [id number] Arrival date & time: 10/23/23  9667     Patient presents with: Headache   Rachael Hahn is a 28 y.o. female.   Patient with a history of migraines for which she takes Topamax .  She is here with a 1 month history of posterior headache which is atypical of her migraines.  Headaches for her migraine are usually frontal but now she is having pain in the posterior head that comes and goes.  Sometimes comes on suddenly other times comes on gradually.  It is worse with palpation of the back of her head.  She denies thunderclap onset.  She denies any change in her vision.  She denies any nausea or vomiting.  No photophobia or phonophobia.  No focal weakness, numbness or tingling.  She was seen here in April when she is having some numbness and ruled out for stroke and thought to be having a complicated migraine.  Reports she does not have any numbness currently or weakness currently.  She comes in tonight because the headache is worse than usual.  It does happen every day and gradually builds up.  It is worse with palpation.  She called her neurologist and was recently instructed to increase her Topamax  to 100 mg daily.  The history is provided by the patient.  Headache Associated symptoms: no abdominal pain, no congestion, no dizziness, no fever, no myalgias, no nausea, no photophobia, no vomiting and no weakness        Prior to Admission medications   Medication Sig Start Date End Date Taking? Authorizing Provider  albuterol  (VENTOLIN  HFA) 108 (90 Base) MCG/ACT inhaler Inhale 1-2 puffs into the lungs every 6 (six) hours as needed for wheezing or shortness of breath. 07/26/23   Merilee Andrea CROME, NP  amLODipine  (NORVASC ) 5 MG tablet TAKE 1 TABLET(5 MG) BY MOUTH DAILY 07/13/23   McElwee, Lauren A, NP  aspirin -acetaminophen -caffeine  (EXCEDRIN  MIGRAINE) 250-250-65 MG tablet Take 1 tablet by mouth daily as  needed for headache or migraine. 10/10/23   Mayer, Jodi R, NP  clobetasol  cream (TEMOVATE ) 0.05 % Apply 1 Application topically 2 (two) times daily. Patient not taking: Reported on 10/11/2023 02/23/23   Nedra Tinnie LABOR, NP  Naltrexone -buPROPion  HCl ER 8-90 MG TB12 Start 1 tablet every morning for 7 days, then 1 tablet twice daily for 7 days 10/11/23   McElwee, Lauren A, NP  naproxen  (NAPROSYN ) 500 MG tablet TAKE 1 TABLET(500 MG) BY MOUTH TWICE DAILY WITH A MEAL 06/08/23   McElwee, Lauren A, NP  topiramate  (TOPAMAX ) 50 MG tablet Take 2 tablets (100 mg total) by mouth at bedtime. 05/24/23   McElwee, Lauren A, NP  Ubrogepant  (UBRELVY ) 100 MG TABS Take 1 tablet (100 mg total) by mouth daily as needed (migraine headache). 10/11/23   McElwee, Lauren A, NP    Allergies: Amoxicillin and Penicillin g    Review of Systems  Constitutional:  Negative for activity change, appetite change and fever.  HENT:  Negative for congestion.   Eyes:  Negative for photophobia and visual disturbance.  Respiratory:  Negative for chest tightness and shortness of breath.   Cardiovascular:  Negative for chest pain.  Gastrointestinal:  Negative for abdominal pain, nausea and vomiting.  Genitourinary:  Negative for dysuria and hematuria.  Musculoskeletal:  Negative for arthralgias and myalgias.  Neurological:  Positive for headaches. Negative for dizziness and weakness.   all other systems are negative except as noted  in the HPI and PMH.    Updated Vital Signs BP 137/76   Pulse (!) 57   Temp 98.2 F (36.8 C) (Oral)   Resp 18   Ht 5' 4 (1.626 m)   Wt 97.1 kg   LMP 10/03/2023 (Exact Date)   SpO2 100%   BMI 36.73 kg/m   Physical Exam Vitals and nursing note reviewed.  Constitutional:      General: She is not in acute distress.    Appearance: She is well-developed.  HENT:     Head: Normocephalic and atraumatic.     Comments: Reproducible pain to posterior scalp    Mouth/Throat:     Pharynx: No oropharyngeal  exudate.  Eyes:     Conjunctiva/sclera: Conjunctivae normal.     Pupils: Pupils are equal, round, and reactive to light.  Neck:     Comments: No meningismus. Cardiovascular:     Rate and Rhythm: Normal rate and regular rhythm.     Heart sounds: Normal heart sounds. No murmur heard. Pulmonary:     Effort: Pulmonary effort is normal. No respiratory distress.     Breath sounds: Normal breath sounds.  Abdominal:     Palpations: Abdomen is soft.     Tenderness: There is no abdominal tenderness. There is no guarding or rebound.  Musculoskeletal:        General: No tenderness. Normal range of motion.     Cervical back: Normal range of motion and neck supple.     Comments: No midline T or L spine tenderness  Skin:    General: Skin is warm.  Neurological:     Mental Status: She is alert and oriented to person, place, and time.     Cranial Nerves: No cranial nerve deficit.     Motor: No abnormal muscle tone.     Coordination: Coordination normal.     Comments: CN 2-12 intact, no ataxia on finger to nose, no nystagmus, 5/5 strength throughout, no pronator drift, Romberg negative, normal gait.   Psychiatric:        Behavior: Behavior normal.     (all labs ordered are listed, but only abnormal results are displayed) Labs Reviewed  COMPREHENSIVE METABOLIC PANEL WITH GFR - Abnormal; Notable for the following components:      Result Value   CO2 18 (*)    All other components within normal limits  CBC WITH DIFFERENTIAL/PLATELET  HCG, SERUM, QUALITATIVE    EKG: None  Radiology: CT VENOGRAM HEAD Result Date: 10/23/2023 EXAM: CT VENOGRAM WITH CONTAST 10/23/2023 06:45:27 AM TECHNIQUE: CT venogram of the head/brain was performed with the administration of intravenous contrast. Multiplanar reformatted images are provided for review. MIP images are provided for review. Automated exposure control, iterative reconstruction, and/or weight based adjustment of the mA/kV was utilized to reduce the  radiation dose to as low as reasonably achievable. COMPARISON: CT angiogram of the head dated 05/23/2023. CLINICAL HISTORY: Headache, new onset (Age >= 51y). Hx of migraines, takes Topamax . C/o pain in back of head, relates pain to feeling like a scarf or doorag is too tight on her head. States bilateral arms feel weak intermittently, this is not present right now. FINDINGS: BRAIN/VENTRICLES: No acute intracranial hemorrhage. No extra axial fluid collection. Gray-white differentiation is maintained. No mass effect or midline shift. No hydrocephalus. ORBITS: No acute abnormality. SINUSES AND MASTOIDS: No acute abnormality. SOFT TISSUES AND SKULL: No acute abnormality. CT VENOGRAM: No dural venous sinus thrombosis. No significant stenosis. IMPRESSION: 1. No acute intracranial abnormality.  2. No dural venous sinus thrombosis. Electronically signed by: Evalene Coho MD 10/23/2023 06:56 AM EDT RP Workstation: [id number]   CT ANGIO HEAD NECK W WO CM Result Date: 10/23/2023 EXAM: CT HEAD WITHOUT CTA HEAD AND NECK WITH AND WITHOUT 10/23/2023 06:45:27 AM TECHNIQUE: CTA of the head and neck was performed with and without the administration of intravenous contrast. Noncontrast CT of the head with reconstructed 2-D images are also provided for review. Multiplanar 2D and/or 3D reformatted images are provided for review. Automated exposure control, iterative reconstruction, and/or weight based adjustment of the mA/kV was utilized to reduce the radiation dose to as low as reasonably achievable. COMPARISON: None available CLINICAL HISTORY: Neuro deficit, acute, stroke suspected. Hx of migraines, takes Topamax . C/o pain in back of head, relates pain to feeling like a scarf or doorag is too tight on her head. States bilateral arms feel weak intermittently, this is not present right now. FINDINGS: BRAIN AND VENTRICLES: No acute intracranial hemorrhage. No mass effect. No midline shift. No extra-axial fluid collection. No  evidence of acute infarct. No hydrocephalus. ORBITS: No acute abnormality. SINUSES AND MASTOIDS: No acute abnormality. AORTIC ARCH AND ARCH VESSELS: No dissection or arterial injury. No significant stenosis of the brachiocephalic or subclavian arteries. CERVICAL CAROTID ARTERIES: No dissection, arterial injury, or hemodynamically significant stenosis by NASCET criteria. CERVICAL VERTEBRAL ARTERIES: No dissection, arterial injury, or significant stenosis. LUNGS AND MEDIASTINUM: Unremarkable. SOFT TISSUES: No acute abnormality. BONES: No acute abnormality. ANTERIOR CIRCULATION: No significant stenosis of the internal carotid arteries. No significant stenosis of the anterior cerebral arteries. No significant stenosis of the middle cerebral arteries. No aneurysm. POSTERIOR CIRCULATION: No significant stenosis of the posterior cerebral arteries. No significant stenosis of the basilar artery. No significant stenosis of the vertebral arteries. No aneurysm. OTHER: No dural venous sinus thrombosis on this non-dedicated study. IMPRESSION: 1. No large vessel occlusion in the head or neck. Electronically signed by: Evalene Coho MD 10/23/2023 06:54 AM EDT RP Workstation: [id number]     Procedures   Medications Ordered in the ED  ketorolac  (TORADOL ) 30 MG/ML injection 15 mg (has no administration in time range)  metoCLOPramide  (REGLAN ) injection 10 mg (has no administration in time range)  diphenhydrAMINE  (BENADRYL ) injection 12.5 mg (has no administration in time range)                                    Medical Decision Making Amount and/or Complexity of Data Reviewed Labs: ordered. Decision-making details documented in ED Course. Radiology: ordered and independent interpretation performed. Decision-making details documented in ED Course. ECG/medicine tests: ordered and independent interpretation performed. Decision-making details documented in ED Course.  Risk Prescription drug  management.   Patient with a history of migraines here with a 1 month history of posterior headache that comes and goes waxing waning in severity.  No focal weakness, numbness or tingling.  No fever.  No visual changes.  No meningismus.  Neurological exam is currently nonfocal.  Low concern for subarachnoid hemorrhage, meningitis, temporal arteritis.  She was seen several months ago because of numbness in her arms and is concerned about that but she does not have any numbness today.  Given reproducible pain to posterior scalp, consider occipital neuralgia.  CTA negative for hemorrhage. Negative for aneurysm or large vessel occlusion. CTV negative.   Pain improved with toradol , she declined reglan . Will initiate course of steroids and recommend followup with her neurologist.  No emergent cause of headache identified on workup today.  Return precautions discussed.      Final diagnoses:  Bad headache    ED Discharge Orders     None          Keyleigh Manninen, Garnette, MD 10/23/23 [phone]

## 2023-10-23 NOTE — ED Triage Notes (Signed)
 Hx of migraines, takes Topamax . C/o pain in back of head, relates pain to feeling like a scarf or doorag is too tight on her head. States bilateral arms feel weak intermittently, this is not present right now.

## 2023-10-23 NOTE — Discharge Instructions (Signed)
 Testing is reassuring.  Your CT scan is normal.  No evidence of aneurysms or black blood vessels.  Follow-up with your neurologist and take the steroids as prescribed.  Return to the ED with a worsening headache, unilateral weakness, numbness, tingling, difficulty speaking or difficulty swallowing concerns.

## 2023-10-23 NOTE — Telephone Encounter (Signed)
 Requesting: AMLODIPINE  BESYLATE 5MG  TABLETS  Last Visit: 05/05/2023 Next Visit: 11/23/2023 Last Refill: 07/13/2023  Please Advise

## 2023-11-06 ENCOUNTER — Ambulatory Visit
Admission: EM | Admit: 2023-11-06 | Discharge: 2023-11-06 | Disposition: A | Discharge status: Home / Self Care | Attending: Family Medicine | Admitting: Family Medicine

## 2023-11-06 ENCOUNTER — Other Ambulatory Visit: Payer: Self-pay

## 2023-11-06 DIAGNOSIS — L235 Allergic contact dermatitis due to other chemical products: Secondary | ICD-10-CM | POA: Diagnosis not present

## 2023-11-06 MED ORDER — PREDNISONE 20 MG PO TABS: 40.0000 mg | ORAL_TABLET | Freq: Every day | ORAL | 0 refills | Status: AC

## 2023-11-06 NOTE — Discharge Instructions (Signed)
 Start prednisone  daily for 5 days.  Follow-up with your PCP if your symptoms do not improve.  Please go to the ER for any worsening symptoms.  Hope you feel better soon!

## 2023-11-06 NOTE — ED Triage Notes (Signed)
 Pt c/o bumps on posterior of head that started 3 days after going to hair salon and getting head shaved and a perm. Pt states that is was happened to her a few weeks ago also after the salon. PT states she has a burning sensation on skin of head.

## 2023-11-06 NOTE — ED Provider Notes (Signed)
 [facility]    CSN: [id number] Arrival date & time: 11/06/23  1105      History   Chief Complaint No chief complaint on file.   HPI Rachael Hahn is a 28 y.o. female presents for itching of scalp.  Patient states she went to the her son 3 days ago and had a perm/haircut and states he did use a different permanent solution.  States that shortly after that she developed some bumps behind her right ear as well as itching and burning along her posterior head.  Denies headache or nausea vomiting.  No swelling.  No pain.  She was in the ER 2 weeks ago for a complex migraine with negative imaging.  Was started on a Medrol  Dosepak with resolution of symptoms.  She has been using a hair growth oil on the area to help.  No other concerns at this time.  HPI  Past Medical History:  Diagnosis Date   Arthritis    Depression    Hypertension    Migraine    Trichomoniasis     Patient Active Problem List   Diagnosis Date Noted   Routine general medical examination at a health care facility 05/05/2023   Numbness and tingling 05/05/2023   Dry skin 05/05/2023   Vaginal irritation 02/23/2023   Blurry vision 12/29/2022   Palpitation 11/02/2022   Primary hypertension 06/08/2022   Chronic migraine w/o aura w/o status migrainosus, not intractable 06/08/2022   Vitamin D  deficiency 06/08/2022   Obesity (BMI 30-39.9) 06/08/2022    History reviewed. No pertinent surgical history.  OB History   No obstetric history on file.      Home Medications    Prior to Admission medications   Medication Sig Start Date End Date Taking? Authorizing Provider  predniSONE  (DELTASONE ) 20 MG tablet Take 2 tablets (40 mg total) by mouth daily with breakfast for 5 days. 11/06/23 11/11/23 Yes Dillie Burandt, Jodi R, NP  albuterol  (VENTOLIN  HFA) 108 (90 Base) MCG/ACT inhaler Inhale 1-2 puffs into the lungs every 6 (six) hours as needed for wheezing or shortness of breath. 07/26/23   Merilee Andrea CROME, NP   amLODipine  (NORVASC ) 5 MG tablet TAKE 1 TABLET(5 MG) BY MOUTH DAILY 10/23/23   McElwee, Tinnie LABOR, NP  aspirin -acetaminophen -caffeine  (EXCEDRIN  MIGRAINE) 250-250-65 MG tablet Take 1 tablet by mouth daily as needed for headache or migraine. 10/10/23   Anjela Cassara, Jodi R, NP  clobetasol  cream (TEMOVATE ) 0.05 % Apply 1 Application topically 2 (two) times daily. Patient not taking: Reported on 10/11/2023 02/23/23   Nedra Tinnie LABOR, NP  Naltrexone -buPROPion  HCl ER 8-90 MG TB12 Start 1 tablet every morning for 7 days, then 1 tablet twice daily for 7 days 10/11/23   McElwee, Lauren A, NP  naproxen  (NAPROSYN ) 500 MG tablet TAKE 1 TABLET(500 MG) BY MOUTH TWICE DAILY WITH A MEAL 06/08/23   McElwee, Lauren A, NP  topiramate  (TOPAMAX ) 50 MG tablet Take 2 tablets (100 mg total) by mouth at bedtime. 05/24/23   McElwee, Lauren A, NP  Ubrogepant  (UBRELVY ) 100 MG TABS Take 1 tablet (100 mg total) by mouth daily as needed (migraine headache). 10/11/23   McElwee, Tinnie LABOR, NP    Family History Family History  Problem Relation Age of Onset   Hypertension Mother        off medication   Hypertension Father    Diabetes Maternal Aunt    Hypertension Maternal Aunt     Social History Social History   Tobacco Use   Smoking  status: Never   Smokeless tobacco: Never  Vaping Use   Vaping status: Never Used  Substance Use Topics   Alcohol use: Yes    Comment: occasionally   Drug use: Not Currently     Allergies   Amoxicillin and Penicillin g   Review of Systems Review of Systems  Skin:  Positive for rash.     Physical Exam Triage Vital Signs ED Triage Vitals  Encounter Vitals Group     BP 11/06/23 1149 111/73     Girls Systolic BP Percentile --      Girls Diastolic BP Percentile --      Boys Systolic BP Percentile --      Boys Diastolic BP Percentile --      Pulse Rate 11/06/23 1149 (!) 56     Resp 11/06/23 1149 16     Temp 11/06/23 1149 98.8 F (37.1 C)     Temp Source 11/06/23 1149 Oral     SpO2  11/06/23 1149 97 %     Weight --      Height --      Head Circumference --      Peak Flow --      Pain Score 11/06/23 1147 8     Pain Loc --      Pain Education --      Exclude from Growth Chart --    No data found.  Updated Vital Signs BP 111/73   Pulse (!) 56   Temp 98.8 F (37.1 C) (Oral)   Resp 16   LMP 11/01/2023 (Exact Date)   SpO2 97%   Visual Acuity Right Eye Distance:   Left Eye Distance:   Bilateral Distance:    Right Eye Near:   Left Eye Near:    Bilateral Near:     Physical Exam Vitals and nursing note reviewed.  Constitutional:      General: She is not in acute distress.    Appearance: Normal appearance. She is not ill-appearing.  HENT:     Head: Normocephalic and atraumatic.      Comments: There is some small nonerythematous papular rash behind the right ear.  Itching extends from this area across the entire posterior scalp.  There is no other visible rashes.  No vesicles or lesions. Eyes:     Pupils: Pupils are equal, round, and reactive to light.  Cardiovascular:     Rate and Rhythm: Normal rate.  Pulmonary:     Effort: Pulmonary effort is normal.  Skin:    General: Skin is warm and dry.  Neurological:     General: No focal deficit present.     Mental Status: She is alert and oriented to person, place, and time.  Psychiatric:        Mood and Affect: Mood normal.        Behavior: Behavior normal.      UC Treatments / Results  Labs (all labs ordered are listed, but only abnormal results are displayed) Labs Reviewed - No data to display  EKG   Radiology No results found.  Procedures Procedures (including critical care time)  Medications Ordered in UC Medications - No data to display  Initial Impression / Assessment and Plan / UC Course  I have reviewed the triage vital signs and the nursing notes.  Pertinent labs & imaging results that were available during my care of the patient were reviewed by me and considered in my medical  decision making (see chart for details).  Reviewed exam and symptoms with patient.  No red flags.  Presentation not consistent with shingles.  Discussed contact dermatitis from new hair treatment.  Will do prednisone  daily for 5 days.  May take OTC Benadryl  as needed.  PCP follow-up if symptoms do not improve.  ER precautions reviewed Final Clinical Impressions(s) / UC Diagnoses   Final diagnoses:  Allergic dermatitis due to other chemical product     Discharge Instructions      Start prednisone  daily for 5 days.  Follow-up with your PCP if your symptoms do not improve.  Please go to the ER for any worsening symptoms.  Hope you feel better soon!    ED Prescriptions     Medication Sig Dispense Auth. Provider   predniSONE  (DELTASONE ) 20 MG tablet Take 2 tablets (40 mg total) by mouth daily with breakfast for 5 days. 10 tablet Chelise Hanger, Jodi R, NP      PDMP not reviewed this encounter.   Loreda Myla SAUNDERS, NP 11/06/23 1214

## 2023-11-13 ENCOUNTER — Telehealth: Admitting: Nurse Practitioner

## 2023-11-13 ENCOUNTER — Encounter: Payer: Self-pay | Admitting: Nurse Practitioner

## 2023-11-13 DIAGNOSIS — L253 Unspecified contact dermatitis due to other chemical products: Secondary | ICD-10-CM

## 2023-11-13 DIAGNOSIS — L259 Unspecified contact dermatitis, unspecified cause: Secondary | ICD-10-CM | POA: Insufficient documentation

## 2023-11-13 DIAGNOSIS — G43709 Chronic migraine without aura, not intractable, without status migrainosus: Secondary | ICD-10-CM | POA: Diagnosis not present

## 2023-11-13 MED ORDER — METHYLPREDNISOLONE 4 MG PO TBPK: ORAL_TABLET | ORAL | 0 refills | Status: DC

## 2023-11-13 MED ORDER — UBRELVY 100 MG PO TABS: 100.0000 mg | ORAL_TABLET | Freq: Every day | ORAL | 1 refills | Status: AC | PRN

## 2023-11-13 NOTE — Progress Notes (Signed)
 [facility] [address] [place] [address] Dept: [phone] Dept Fax: [phone]  Virtual Video Visit  I connected with Keitha Pinal on 11/13/23 at 11:00 AM EDT by a video enabled telemedicine application and verified that I am speaking with the correct person using two identifiers.  Location patient: Home Location provider: Clinic Persons participating in the virtual visit: Patient; Rachael Harada, NP; Laymon Gladis Sharps, CMA  I discussed the limitations of evaluation and management by telemedicine and the availability of in person appointments. The patient expressed understanding and agreed to proceed.  Chief Complaint  Patient presents with   Rash    With pain to back of head    SUBJECTIVE:  HPI:   Discussed the use of AI scribe software for clinical note transcription with the patient, who gave verbal consent to proceed.  History of Present Illness   Rachael Hahn is a 28 year old female who presents with inflammation and pain at the back of her head following chemical exposure at a hair salon.  Three days after a perm treatment, she experienced inflammation and pain at the back of her head. Steroids provided some relief. After another salon visit with different chemicals, symptoms recurred with itching, burning, and significant pain. Prednisone  alleviated itching and burning but not pain. Ibuprofen  800 mg has been ineffective for pain relief. She notes the medrol  helped more than the prednisone .   She experiences headaches, which she associates with the issues at the back of her head. She takes Ubrelvy  for migraines and only has headaches when experiencing problems at the back of her head. She notes the topamax  100mg  has been helping prevent her headaches and is also decreasing her appetite.      Patient Active Problem List   Diagnosis Date Noted   Contact dermatitis 11/13/2023   Routine general  medical examination at a health care facility 05/05/2023   Numbness and tingling 05/05/2023   Dry skin 05/05/2023   Vaginal irritation 02/23/2023   Blurry vision 12/29/2022   Palpitation 11/02/2022   Primary hypertension 06/08/2022   Chronic migraine w/o aura w/o status migrainosus, not intractable 06/08/2022   Vitamin D  deficiency 06/08/2022   Obesity (BMI 30-39.9) 06/08/2022    History reviewed. No pertinent surgical history.  Family History  Problem Relation Age of Onset   Hypertension Mother        off medication   Hypertension Father    Diabetes Maternal Aunt    Hypertension Maternal Aunt     Social History   Tobacco Use   Smoking status: Never   Smokeless tobacco: Never  Vaping Use   Vaping status: Never Used  Substance Use Topics   Alcohol use: Yes    Comment: occasionally   Drug use: Not Currently     Current Outpatient Medications:    albuterol  (VENTOLIN  HFA) 108 (90 Base) MCG/ACT inhaler, Inhale 1-2 puffs into the lungs every 6 (six) hours as needed for wheezing or shortness of breath., Disp: 18 g, Rfl: 0   amLODipine  (NORVASC ) 5 MG tablet, TAKE 1 TABLET(5 MG) BY MOUTH DAILY, Disp: 90 tablet, Rfl: 0   aspirin -acetaminophen -caffeine  (EXCEDRIN  MIGRAINE) 250-250-65 MG tablet, Take 1 tablet by mouth daily as needed for headache or migraine., Disp: 15 tablet, Rfl: 0   topiramate  (TOPAMAX ) 50 MG tablet, Take 2 tablets (100 mg total) by mouth at bedtime., Disp: 60 tablet, Rfl: 11   Ubrogepant  (UBRELVY ) 100 MG TABS, Take 1 tablet (100 mg total) by  mouth daily as needed (migraine headache)., Disp: 9 tablet, Rfl: 1   clobetasol  cream (TEMOVATE ) 0.05 %, Apply 1 Application topically 2 (two) times daily. (Patient not taking: Reported on 11/13/2023), Disp: 30 g, Rfl: 0   methylPREDNISolone  (MEDROL  DOSEPAK) 4 MG TBPK tablet, As directed, Disp: 21 tablet, Rfl: 0   Naltrexone -buPROPion  HCl ER 8-90 MG TB12, Start 1 tablet every morning for 7 days, then 1 tablet twice daily for 7  days (Patient not taking: Reported on 11/13/2023), Disp: 120 tablet, Rfl: 0   naproxen  (NAPROSYN ) 500 MG tablet, TAKE 1 TABLET(500 MG) BY MOUTH TWICE DAILY WITH A MEAL, Disp: 30 tablet, Rfl: 1  Allergies  Allergen Reactions   Amoxicillin Other (See Comments)    Patient states yeast infection    Penicillin G     Other Reaction(s): yeast inf    ROS: See pertinent positives and negatives per HPI.  OBSERVATIONS/OBJECTIVE:  VITALS per patient if applicable: Today's Vitals   There is no height or weight on file to calculate BMI.    GENERAL: Alert and oriented. Appears well and in no acute distress.  HEENT: Atraumatic. Conjunctiva clear. No obvious abnormalities on inspection of external nose and ears.  NECK: Normal movements of the head and neck.  LUNGS: On inspection, no signs of respiratory distress. Breathing rate appears normal. No obvious gross SOB, gasping or wheezing, and no conversational dyspnea.  CV: No obvious cyanosis.  MS: Moves all visible extremities without noticeable abnormality.  PSYCH/NEURO: Pleasant and cooperative. No obvious depression or anxiety. Speech and thought processing grossly intact.  ASSESSMENT AND PLAN:  Problem List Items Addressed This Visit       Cardiovascular and Mediastinum   Chronic migraine w/o aura w/o status migrainosus, not intractable   Chronic, improving. Migraines have improved with topamax  100mg  at bedtime. The dermatitis is causing her to have migraines currently. Will refill ubrelvy  100mg  at first sign of headache and continue topamax  daily.       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 Prednisone  relieves itching, but ibuprofen  does not alleviate pain. Start Medrol  Dosepak in the morning with food. Advise avoiding chemical treatments  until resolution.        I discussed the assessment and treatment plan with the patient. The patient was provided an opportunity to ask questions and all were answered. The patient agreed with the plan and demonstrated an understanding of the instructions.   The patient was advised to call back or seek an in-person evaluation if the symptoms worsen or if the condition fails to improve as anticipated.   Rachael DELENA Harada, NP

## 2023-11-13 NOTE — Assessment & Plan Note (Signed)
 Chronic, improving. Migraines have improved with topamax  100mg  at bedtime. The dermatitis is causing her to have migraines currently. Will refill ubrelvy  100mg  at first sign of headache and continue topamax  daily.

## 2023-11-13 NOTE — Assessment & Plan Note (Signed)
 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 Prednisone  relieves itching, but ibuprofen  does not alleviate pain. Start Medrol  Dosepak in the morning with food. Advise avoiding chemical treatments until resolution.

## 2023-11-13 NOTE — Patient Instructions (Signed)
 It was great to see you!  Start medrol  dose pak as instructed on label  Continue washing the back of your head daily   Let's follow-up with any worsening symptoms or concerns.   Take care,  Tinnie Harada, NP

## 2023-11-16 ENCOUNTER — Encounter: Payer: Self-pay | Admitting: Nurse Practitioner

## 2023-11-17 ENCOUNTER — Other Ambulatory Visit: Payer: Self-pay | Admitting: Nurse Practitioner

## 2023-11-20 NOTE — Telephone Encounter (Signed)
 Requesting: UBRELVY  100MG  TABLETS  Last Visit: 05/05/2023 Next Visit: 11/23/2023 Last Refill: 11/13/2023 qty 9 with 1 refill  Please Advise

## 2023-11-20 NOTE — Telephone Encounter (Signed)
 Pharmacy called and Rx is ready for pick up.

## 2023-11-23 ENCOUNTER — Ambulatory Visit: Admitting: Nurse Practitioner

## 2023-11-29 ENCOUNTER — Ambulatory Visit (INDEPENDENT_AMBULATORY_CARE_PROVIDER_SITE_OTHER): Admitting: Nurse Practitioner

## 2023-11-29 ENCOUNTER — Encounter: Payer: Self-pay | Admitting: Nurse Practitioner

## 2023-11-29 VITALS — BP 132/82 | HR 52 | Temp 97.8°F | Ht 64.0 in | Wt 213.8 lb

## 2023-11-29 DIAGNOSIS — L253 Unspecified contact dermatitis due to other chemical products: Secondary | ICD-10-CM

## 2023-11-29 MED ORDER — KETOCONAZOLE 2 % EX SHAM: 1.0000 | MEDICATED_SHAMPOO | CUTANEOUS | 0 refills | Status: AC

## 2023-11-29 MED ORDER — CYCLOBENZAPRINE HCL 10 MG PO TABS: 10.0000 mg | ORAL_TABLET | Freq: Three times a day (TID) | ORAL | 0 refills | Status: AC | PRN

## 2023-11-29 MED ORDER — METHYLPREDNISOLONE 4 MG PO TBPK: ORAL_TABLET | ORAL | 0 refills | Status: DC

## 2023-11-29 NOTE — Progress Notes (Unsigned)
   Established Patient Office Visit  Subjective   Patient ID: Rachael Hahn, female    DOB: 11/01/1995  Age: 28 y.o. MRN: [id number]  Chief Complaint  Patient presents with   Headache    Frequent headaches and complains of inflammation in back of head reoccuring    HPI  {History (Optional):23778}  ROS    Objective:     BP 132/82 (BP Location: Left Arm, Patient Position: Sitting, Cuff Size: Normal) Comment: has not taken BP medication today  Pulse (!) 52   Temp 97.8 F (36.6 C)   Ht 5' 4 (1.626 m)   Wt 213 lb 12.8 oz (97 kg)   LMP 11/01/2023 (Exact Date)   SpO2 99%   BMI 36.70 kg/m  {Vitals History (Optional):23777}  Physical Exam   No results found for any visits on 11/29/23.  {Labs (Optional):23779}  The ASCVD Risk score (Arnett DK, et al., 2019) failed to calculate for the following reasons:   The 2019 ASCVD risk score is only valid for ages 14 to 67    Assessment & Plan:   Problem List Items Addressed This Visit   None   No follow-ups on file.    Rachael DELENA Harada, NP

## 2023-11-29 NOTE — Patient Instructions (Signed)
 It was great to see you!  Start medrol  dose pak  Start ketoconazole shampoo twice a week  Stop wearing hats and anything tight around your head  I have placed a referral to dermatology   Let's follow-up in 6 months, sooner if you have concerns.  If a referral was placed today, you will be contacted for an appointment. Please note that routine referrals can sometimes take up to 3-4 weeks to process. Please call our office if you haven't heard anything after this time frame.  Take care,  Tinnie Harada, NP

## 2023-11-30 ENCOUNTER — Ambulatory Visit: Admitting: Nurse Practitioner

## 2023-11-30 NOTE — Assessment & Plan Note (Signed)
 Recurrent inflammation and pain at the occipital region may be due to psoriasis or autoimmune conditions. Methylprednisolone  has been effective, while prednisone  has not. Hats may exacerbate the condition. Start Medrol  Dosepak (methylprednisolone ) and prescribe ketoconazole shampoo twice weekly. Refer to dermatology for further evaluation. Advise against wearing hats or tight headgear and provide a work note to excuse from wearing hats. Allow wearing a bonnet if necessary.

## 2023-12-06 ENCOUNTER — Encounter: Payer: Self-pay | Admitting: Nurse Practitioner

## 2023-12-11 ENCOUNTER — Encounter: Payer: Self-pay | Admitting: Nurse Practitioner

## 2023-12-11 ENCOUNTER — Telehealth: Payer: Self-pay | Admitting: Nurse Practitioner

## 2023-12-11 NOTE — Telephone Encounter (Signed)
 Noted

## 2023-12-11 NOTE — Telephone Encounter (Signed)
 06/29/2023 and 11/23/2023 no shows  Final warning sent via mail and mychart

## 2023-12-13 ENCOUNTER — Ambulatory Visit: Admitting: Nurse Practitioner

## 2023-12-13 ENCOUNTER — Telehealth: Payer: Self-pay | Admitting: Neurology

## 2023-12-13 ENCOUNTER — Encounter: Payer: Self-pay | Admitting: Nurse Practitioner

## 2023-12-13 VITALS — BP 110/72 | HR 64 | Temp 98.2°F | Ht 64.0 in | Wt 215.4 lb

## 2023-12-13 DIAGNOSIS — L253 Unspecified contact dermatitis due to other chemical products: Secondary | ICD-10-CM

## 2023-12-13 DIAGNOSIS — R519 Headache, unspecified: Secondary | ICD-10-CM

## 2023-12-13 MED ORDER — GABAPENTIN 300 MG PO CAPS: 300.0000 mg | ORAL_CAPSULE | Freq: Every day | ORAL | 1 refills | Status: DC

## 2023-12-13 NOTE — Progress Notes (Signed)
 Established Patient Office Visit  Subjective   Patient ID: Rachael Hahn, female    DOB: September 03, 1995  Age: 28 y.o. MRN: [id number]  Chief Complaint  Patient presents with   Headache    Feels pain at back of head, more pain at night    HPI Discussed the use of AI scribe software for clinical note transcription with the patient, who gave verbal consent to proceed.  History of Present Illness   Rachael Hahn is a 28 year old female who presents with persistent scalp pain and headaches.  She experiences persistent sore, burning, aching, and sometimes throbbing pain localized to the back of her head, specifically in the scalp area. The pain worsens at night and sometimes extends to her neck. She uses ketoconazole shampoo that helps with scalp flaking but not with the pain. A Medrol  Dosepak was previously prescribed without relief, and she takes Topamax  at night.  She occasionally experiences nausea, with an episode last night, but no vomiting occurs. She denies wearing tight hats. Her work in a [employer] with strong smells is associated with worsening symptoms. Symptoms began after a hair treatment, but no recent treatments have been done.       ROS See pertinent positives and negatives per HPI.    Objective:     BP 110/72 (BP Location: Left Arm, Patient Position: Sitting, Cuff Size: Normal)   Pulse 64   Temp 98.2 F (36.8 C)   Ht 5' 4 (1.626 m)   Wt 215 lb 6.4 oz (97.7 kg)   SpO2 99%   BMI 36.97 kg/m  BP Readings from Last 3 Encounters:  12/13/23 110/72  11/29/23 132/82  11/06/23 111/73   Wt Readings from Last 3 Encounters:  12/13/23 215 lb 6.4 oz (97.7 kg)  11/29/23 213 lb 12.8 oz (97 kg)  10/23/23 214 lb (97.1 kg)      Physical Exam Vitals and nursing note reviewed.  Constitutional:      General: She is not in acute distress.    Appearance: Normal appearance.  HENT:     Head: Normocephalic.  Eyes:     Conjunctiva/sclera:  Conjunctivae normal.  Cardiovascular:     Rate and Rhythm: Normal rate and regular rhythm.     Pulses: Normal pulses.     Heart sounds: Normal heart sounds.  Pulmonary:     Effort: Pulmonary effort is normal.     Breath sounds: Normal breath sounds.  Musculoskeletal:        General: Tenderness (right occiptal area of scalp) present.     Cervical back: Normal range of motion.  Skin:    General: Skin is warm.     Findings: No erythema or rash.  Neurological:     General: No focal deficit present.     Mental Status: She is alert and oriented to person, place, and time.  Psychiatric:        Mood and Affect: Mood normal.        Behavior: Behavior normal.        Thought Content: Thought content normal.        Judgment: Judgment normal.      Assessment & Plan:   Problem List Items Addressed This Visit       Musculoskeletal and Integument   Contact dermatitis   Symptoms have improved. Continue ketoconazole shampoo twice a week.         Other   Occipital headache - Primary   Chronic, not controlled. She experiences  sore, burning, aching, and sometimes throbbing pain localized to the back of the head and scalp. The pain is unresponsive to Medrol  Dosepak.  There is no visible scalp inflammation. Start gabapentin 300mg  at bedtime and continue Topamax  100mg  at bedtime. Follow-up with neurology and in 4 weeks.       Relevant Medications   gabapentin (NEURONTIN) 300 MG capsule    Return in about 4 weeks (around 01/10/2024) for headache .    [name], NP

## 2023-12-13 NOTE — Patient Instructions (Signed)
 It was great to see you!  Continue the shampoo twice a week  Start gabapentin at bedtime   Call neurology and schedule an appointment   Let's follow-up in 4 weeks, sooner if you have concerns.  If a referral was placed today, you will be contacted for an appointment. Please note that routine referrals can sometimes take up to 3-4 weeks to process. Please call our office if you haven't heard anything after this time frame.  Take care,  Tinnie Harada, NP

## 2023-12-13 NOTE — Assessment & Plan Note (Signed)
 Symptoms have improved. Continue ketoconazole shampoo twice a week.

## 2023-12-13 NOTE — Assessment & Plan Note (Addendum)
 Chronic, not controlled. She experiences sore, burning, aching, and sometimes throbbing pain localized to the back of the head and scalp. The pain is unresponsive to Medrol  Dosepak.  There is no visible scalp inflammation. Start gabapentin 300mg  at bedtime and continue Topamax  100mg  at bedtime. Follow-up with neurology and in 4 weeks.

## 2023-12-13 NOTE — Telephone Encounter (Signed)
 Pt called requesting an appointment, call connected to billing dept

## 2023-12-14 ENCOUNTER — Telehealth: Payer: Self-pay

## 2023-12-14 DIAGNOSIS — R519 Headache, unspecified: Secondary | ICD-10-CM

## 2023-12-14 DIAGNOSIS — G43709 Chronic migraine without aura, not intractable, without status migrainosus: Secondary | ICD-10-CM

## 2023-12-14 NOTE — Telephone Encounter (Signed)
 Referral placed.

## 2023-12-14 NOTE — Telephone Encounter (Signed)
 Patient feels that her neurologist is not listening to her and did not examine her and just prescribed medication.

## 2023-12-14 NOTE — Telephone Encounter (Signed)
 Patient is requesting a referral for different neurologist.

## 2023-12-15 NOTE — Telephone Encounter (Signed)
 Left message that a referra was placed and someone will be in contact with her to schedule.

## 2023-12-22 ENCOUNTER — Encounter: Payer: Self-pay | Admitting: Nurse Practitioner

## 2023-12-22 ENCOUNTER — Other Ambulatory Visit (HOSPITAL_COMMUNITY)
Admission: RE | Admit: 2023-12-22 | Discharge: 2023-12-22 | Disposition: A | Discharge status: Home / Self Care | Source: Ambulatory Visit | Attending: Nurse Practitioner | Admitting: Nurse Practitioner

## 2023-12-22 ENCOUNTER — Ambulatory Visit: Admitting: Nurse Practitioner

## 2023-12-22 VITALS — BP 122/80 | HR 72 | Temp 97.8°F | Ht 64.0 in | Wt 218.2 lb

## 2023-12-22 DIAGNOSIS — Z7251 High risk heterosexual behavior: Secondary | ICD-10-CM

## 2023-12-22 DIAGNOSIS — Z113 Encounter for screening for infections with a predominantly sexual mode of transmission: Secondary | ICD-10-CM | POA: Insufficient documentation

## 2023-12-22 DIAGNOSIS — R8290 Unspecified abnormal findings in urine: Secondary | ICD-10-CM

## 2023-12-22 DIAGNOSIS — Z3009 Encounter for other general counseling and advice on contraception: Secondary | ICD-10-CM

## 2023-12-22 LAB — POC URINALSYSI DIPSTICK (AUTOMATED)
Bilirubin, UA: NEGATIVE
Blood, UA: NEGATIVE
Glucose, UA: NEGATIVE
Ketones, UA: NEGATIVE
Nitrite, UA: NEGATIVE
Protein, UA: POSITIVE — AB
Spec Grav, UA: 1.02 (ref 1.010–1.025)
Urobilinogen, UA: 0.2 U/dL
pH, UA: 6 (ref 5.0–8.0)

## 2023-12-22 LAB — POCT URINE PREGNANCY: Preg Test, Ur: NEGATIVE

## 2023-12-22 NOTE — Patient Instructions (Signed)
 It was great to see you!  We are checking your labs today and will let you know the results via mychart/phone.   I have placed a referral to GYN  Let's follow-up with any concerns   Take care,  Tinnie Harada, NP

## 2023-12-22 NOTE — Progress Notes (Unsigned)
 Acute Office Visit  Subjective:     Patient ID: Rachael Hahn, female    DOB: [birth year], 28 y.o.   MRN: [id number]  Chief Complaint  Patient presents with   Medication Management    Discuss medication and STD check-had unprotected sex    HPI Discussed the use of AI scribe software for clinical note transcription with the patient, who gave verbal consent to proceed.  History of Present Illness   Rachael Hahn is a 28 year old female who presents with concerns about potential pregnancy and vaginal discharge.  She took Plan B one hour after unprotected intercourse and is concerned about its effectiveness due to concurrent use of Topamax  and blood pressure medication. Her last menstrual period was on October 19, with an expected start of the next period on November 15. She experiences cramping but no menstrual bleeding, causing anxiety.  Vaginal discharge is present, attributed to her impending period. Lower back pain is noted, associated with her menstrual cycle. She seeks comprehensive testing, including swabs and blood work.  She is interested in starting birth control, considering options like an IUD that do not cause headaches. She previously used Depo-Provera but discontinued due to headaches.  No current abdominal pain, but she feels bloated, associating it with her menstrual cycle. No itching or other symptoms suggestive of an infection.     ROS See pertinent positives and negatives per HPI.     Objective:    BP 122/80 (BP Location: Left Arm, Patient Position: Sitting, Cuff Size: Normal)   Pulse 72   Temp 97.8 F (36.6 C)   Ht 5' 4 (1.626 m)   Wt 218 lb 3.2 oz (99 kg)   LMP 11/26/2023 (Exact Date)   SpO2 100%   BMI 37.45 kg/m  {Vitals History (Optional):23777}  Physical Exam Vitals and nursing note reviewed.  Constitutional:      General: She is not in acute distress.    Appearance: Normal appearance.  HENT:     Head: Normocephalic.  Eyes:      Conjunctiva/sclera: Conjunctivae normal.  Cardiovascular:     Rate and Rhythm: Normal rate and regular rhythm.     Pulses: Normal pulses.     Heart sounds: Normal heart sounds.  Pulmonary:     Effort: Pulmonary effort is normal.     Breath sounds: Normal breath sounds.  Musculoskeletal:     Cervical back: Normal range of motion.  Skin:    General: Skin is warm.  Neurological:     General: No focal deficit present.     Mental Status: She is alert and oriented to person, place, and time.  Psychiatric:        Mood and Affect: Mood normal.        Behavior: Behavior normal.        Thought Content: Thought content normal.        Judgment: Judgment normal.     Results for orders placed or performed in visit on 12/22/23  Urine Culture   Specimen: Urine  Result Value Ref Range   MICRO NUMBER: [id number]    SPECIMEN QUALITY: Adequate    Sample Source URINE    STATUS: FINAL    Result:      Mixed genital flora isolated. These superficial bacteria are not indicative of a urinary tract infection. No further organism identification is warranted on this specimen. If clinically indicated, recollect clean-catch, mid-stream urine and transfer  immediately to Urine Culture Transport Tube.   HIV Antibody (routine  testing w rflx)  Result Value Ref Range   HIV FINAL INTERPRETATION HIV NEGATIVE    HIV 1&2 Ab, 4th Generation NON-REACTIVE NON-REACTIVE  RPR  Result Value Ref Range   RPR Ser Ql NON-REACTIVE NON-REACTIVE  POCT urine pregnancy  Result Value Ref Range   Preg Test, Ur Negative Negative        Assessment & Plan:   Problem List Items Addressed This Visit   None Visit Diagnoses       Unprotected sex    -  Primary   Screening for STI is necessary due to recent unprotected intercourse. Check gonorrhea, chlamydia, BV, yeast, HIV, and U/A. Urine pregnancy negative.   Relevant Orders   HIV Antibody (routine testing w rflx) (Completed)   RPR (Completed)   Cervicovaginal ancillary  only   POCT urine pregnancy (Completed)   POCT Urinalysis Dipstick (Automated)     Screen for STD (sexually transmitted disease)       Screening is necessary due to recent unprotected intercourse. Check gonorrhea, chlamydia, BV, yeast, HIV, and U/A.   Relevant Orders   HIV Antibody (routine testing w rflx) (Completed)   RPR (Completed)   Cervicovaginal ancillary only   POCT Urinalysis Dipstick (Automated)     Birth control counseling       Depo provera caused headache and she has trouble remembering to take a pill at the same time. Referral placed to GYN for possible IUD placement.   Relevant Orders   Ambulatory referral to Gynecology     Abnormal urinalysis       Add on urine culture and treat based on results.   Relevant Orders   Urine Culture (Completed)       No orders of the defined types were placed in this encounter.   Return if symptoms worsen or fail to improve.  [name], NP

## 2023-12-23 LAB — RPR: RPR Ser Ql: NONREACTIVE

## 2023-12-23 LAB — URINE CULTURE
MICRO NUMBER:: 17237217
SPECIMEN QUALITY:: ADEQUATE

## 2023-12-23 LAB — HIV ANTIBODY (ROUTINE TESTING W REFLEX)
HIV 1&2 Ab, 4th Generation: NONREACTIVE
HIV FINAL INTERPRETATION: NEGATIVE

## 2023-12-24 ENCOUNTER — Ambulatory Visit: Payer: Self-pay | Admitting: Nurse Practitioner

## 2023-12-25 LAB — CERVICOVAGINAL ANCILLARY ONLY
Bacterial Vaginitis (gardnerella): NEGATIVE
Candida Glabrata: NEGATIVE
Candida Vaginitis: NEGATIVE
Chlamydia: NEGATIVE
Comment: NEGATIVE
Comment: NEGATIVE
Comment: NEGATIVE
Comment: NEGATIVE
Comment: NEGATIVE
Comment: NORMAL
Neisseria Gonorrhea: NEGATIVE
Trichomonas: NEGATIVE

## 2023-12-27 DIAGNOSIS — L245 Irritant contact dermatitis due to other chemical products: Secondary | ICD-10-CM | POA: Diagnosis not present

## 2023-12-29 ENCOUNTER — Ambulatory Visit: Admitting: Nurse Practitioner

## 2024-01-05 ENCOUNTER — Encounter: Payer: Self-pay | Admitting: Nurse Practitioner

## 2024-01-16 ENCOUNTER — Other Ambulatory Visit: Payer: Self-pay

## 2024-01-16 ENCOUNTER — Ambulatory Visit
Admission: EM | Admit: 2024-01-16 | Discharge: 2024-01-16 | Disposition: A | Discharge status: Home / Self Care | Source: Ambulatory Visit | Attending: Family Medicine | Admitting: Family Medicine

## 2024-01-16 DIAGNOSIS — Z113 Encounter for screening for infections with a predominantly sexual mode of transmission: Secondary | ICD-10-CM | POA: Diagnosis not present

## 2024-01-16 DIAGNOSIS — N898 Other specified noninflammatory disorders of vagina: Secondary | ICD-10-CM

## 2024-01-16 LAB — POCT URINE DIPSTICK
Bilirubin, UA: NEGATIVE
Glucose, UA: NEGATIVE mg/dL
Ketones, POC UA: NEGATIVE mg/dL
Leukocytes, UA: NEGATIVE
Nitrite, UA: NEGATIVE
POC PROTEIN,UA: NEGATIVE
Spec Grav, UA: >=1.03 — AB (ref 1.010–1.025)
Urobilinogen, UA: 0.2 U/dL
pH, UA: 5.5 (ref 5.0–8.0)

## 2024-01-16 LAB — POCT URINE PREGNANCY: Preg Test, Ur: NEGATIVE

## 2024-01-16 MED ORDER — METRONIDAZOLE 500 MG PO TABS: 500.0000 mg | ORAL_TABLET | Freq: Two times a day (BID) | ORAL | 0 refills | Status: AC

## 2024-01-16 NOTE — ED Provider Notes (Signed)
 [facility]    CSN: [id number] Arrival date & time: 01/16/24  1517      History   Chief Complaint No chief complaint on file.   HPI Rachael Hahn is a 28 y.o. female  presents for vaginal discharge.  Patient reports 1 week of vaginal discharge/itching and irritation with mild dysuria.  Denies any fevers, nausea/vomiting, flank pain.  No STD exposure but would like screening.  Has a history of BV and yeast and states this feels more like BV.  Did try Monistat OTC without improvement.  No other concerns at this time  HPI  Past Medical History:  Diagnosis Date   Arthritis    Depression    Hypertension    Migraine    Trichomoniasis     Patient Active Problem List   Diagnosis Date Noted   Occipital headache 12/13/2023   Contact dermatitis 11/13/2023   Routine general medical examination at a health care facility 05/05/2023   Numbness and tingling 05/05/2023   Dry skin 05/05/2023   Vaginal irritation 02/23/2023   Blurry vision 12/29/2022   Palpitation 11/02/2022   Primary hypertension 06/08/2022   Chronic migraine w/o aura w/o status migrainosus, not intractable 06/08/2022   Vitamin D  deficiency 06/08/2022   Obesity (BMI 30-39.9) 06/08/2022    History reviewed. No pertinent surgical history.  OB History   No obstetric history on file.      Home Medications    Prior to Admission medications   Medication Sig Start Date End Date Taking? Authorizing Provider  metroNIDAZOLE  (FLAGYL ) 500 MG tablet Take 1 tablet (500 mg total) by mouth 2 (two) times daily. 01/16/24  Yes Latonda Larrivee, Jodi R, NP  albuterol  (VENTOLIN  HFA) 108 (90 Base) MCG/ACT inhaler Inhale 1-2 puffs into the lungs every 6 (six) hours as needed for wheezing or shortness of breath. 07/26/23   Merilee Andrea CROME, NP  amLODipine  (NORVASC ) 5 MG tablet TAKE 1 TABLET(5 MG) BY MOUTH DAILY 10/23/23   McElwee, Tinnie LABOR, NP  aspirin -acetaminophen -caffeine  (EXCEDRIN  MIGRAINE) 250-250-65 MG tablet Take 1 tablet  by mouth daily as needed for headache or migraine. 10/10/23   Desiraye Rolfson, Jodi R, NP  clobetasol  cream (TEMOVATE ) 0.05 % Apply 1 Application topically 2 (two) times daily. Patient not taking: Reported on 12/22/2023 02/23/23   Nedra Tinnie LABOR, NP  cyclobenzaprine  (FLEXERIL ) 10 MG tablet Take 1 tablet (10 mg total) by mouth 3 (three) times daily as needed for muscle spasms. 11/29/23   McElwee, Lauren A, NP  gabapentin  (NEURONTIN ) 300 MG capsule Take 1 capsule (300 mg total) by mouth at bedtime. 12/13/23   McElwee, Lauren A, NP  ketoconazole  (NIZORAL ) 2 % shampoo Apply 1 Application topically 2 (two) times a week. 11/30/23   McElwee, Tinnie LABOR, NP  Naltrexone -buPROPion  HCl ER 8-90 MG TB12 Start 1 tablet every morning for 7 days, then 1 tablet twice daily for 7 days Patient not taking: Reported on 12/22/2023 10/11/23   McElwee, Lauren A, NP  naproxen  (NAPROSYN ) 500 MG tablet TAKE 1 TABLET(500 MG) BY MOUTH TWICE DAILY WITH A MEAL 06/08/23   McElwee, Lauren A, NP  topiramate  (TOPAMAX ) 50 MG tablet Take 2 tablets (100 mg total) by mouth at bedtime. 05/24/23   McElwee, Lauren A, NP  Ubrogepant  (UBRELVY ) 100 MG TABS Take 1 tablet (100 mg total) by mouth daily as needed (migraine headache). 11/13/23   McElwee, Tinnie LABOR, NP    Family History Family History  Problem Relation Age of Onset   Hypertension Mother  off medication   Hypertension Father    Diabetes Maternal Aunt    Hypertension Maternal Aunt     Social History Social History   Tobacco Use   Smoking status: Never   Smokeless tobacco: Never  Vaping Use   Vaping status: Never Used  Substance Use Topics   Alcohol use: Yes    Comment: occasionally   Drug use: Not Currently     Allergies   Amoxicillin and Penicillin g   Review of Systems Review of Systems  Genitourinary:  Positive for vaginal discharge.     Physical Exam Triage Vital Signs ED Triage Vitals  Encounter Vitals Group     BP 01/16/24 1550 135/81     Girls Systolic BP  Percentile --      Girls Diastolic BP Percentile --      Boys Systolic BP Percentile --      Boys Diastolic BP Percentile --      Pulse Rate 01/16/24 1550 63     Resp 01/16/24 1550 17     Temp 01/16/24 1550 97.8 F (36.6 C)     Temp Source 01/16/24 1550 Oral     SpO2 01/16/24 1550 97 %     Weight --      Height --      Head Circumference --      Peak Flow --      Pain Score 01/16/24 1548 5     Pain Loc --      Pain Education --      Exclude from Growth Chart --    No data found.  Updated Vital Signs BP 135/81   Pulse 63   Temp 97.8 F (36.6 C) (Oral)   Resp 17   LMP 12/24/2023 (Exact Date)   SpO2 97%   Visual Acuity Right Eye Distance:   Left Eye Distance:   Bilateral Distance:    Right Eye Near:   Left Eye Near:    Bilateral Near:     Physical Exam Vitals and nursing note reviewed.  Constitutional:      Appearance: Normal appearance.  HENT:     Head: Normocephalic and atraumatic.  Eyes:     Pupils: Pupils are equal, round, and reactive to light.  Cardiovascular:     Rate and Rhythm: Normal rate.  Pulmonary:     Effort: Pulmonary effort is normal.  Skin:    General: Skin is warm and dry.  Neurological:     General: No focal deficit present.     Mental Status: She is alert and oriented to person, place, and time.  Psychiatric:        Mood and Affect: Mood normal.        Behavior: Behavior normal.      UC Treatments / Results  Labs (all labs ordered are listed, but only abnormal results are displayed) Labs Reviewed  POCT URINE DIPSTICK - Abnormal; Notable for the following components:      Result Value   Clarity, UA hazy (*)    Spec Grav, UA >=1.030 (*)    Blood, UA trace-intact (*)    All other components within normal limits  POCT URINE PREGNANCY  CERVICOVAGINAL ANCILLARY ONLY    EKG   Radiology No results found.  Procedures Procedures (including critical care time)  Medications Ordered in UC Medications - No data to  display  Initial Impression / Assessment and Plan / UC Course  I have reviewed the triage vital signs and the nursing  notes.  Pertinent labs & imaging results that were available during my care of the patient were reviewed by me and considered in my medical decision making (see chart for details).     STD testing/vaginal swab was ordered will contact for any positive results.  Negative UA and UPT.  Will do metronidazole  as patient reports symptoms consistent with previous BV infections.  Encourage rest fluids and PCP or GYN follow-up if symptoms do not improve.  ER precaution reviewed Final Clinical Impressions(s) / UC Diagnoses   Final diagnoses:  Vaginal discharge  Screening examination for STD (sexually transmitted disease)     Discharge Instructions      The clinic will contact you with results of the vaginal swab/STD testing done today positive.  Start metronidazole  twice daily for 7 days.  Please follow-up with your PCP or gynecologist if your symptoms do not improve.  Please go to the emergency room for any worsening symptoms.  Hope you feel better soon!    ED Prescriptions     Medication Sig Dispense Auth. Provider   metroNIDAZOLE  (FLAGYL ) 500 MG tablet Take 1 tablet (500 mg total) by mouth 2 (two) times daily. 14 tablet Arlynn Mcdermid, Jodi R, NP      PDMP not reviewed this encounter.   Loreda Myla SAUNDERS, NP 01/16/24 [phone]

## 2024-01-16 NOTE — Discharge Instructions (Addendum)
 The clinic will contact you with results of the vaginal swab/STD testing done today positive.  Start metronidazole  twice daily for 7 days.  Please follow-up with your PCP or gynecologist if your symptoms do not improve.  Please go to the emergency room for any worsening symptoms.  Hope you feel better soon!

## 2024-01-16 NOTE — ED Triage Notes (Signed)
 Pt c/o white vaginal discharge, itching and irritationx1wk

## 2024-01-17 ENCOUNTER — Other Ambulatory Visit: Payer: Self-pay | Admitting: Nurse Practitioner

## 2024-01-17 LAB — CERVICOVAGINAL ANCILLARY ONLY
Bacterial Vaginitis (gardnerella): NEGATIVE
Candida Glabrata: NEGATIVE
Candida Vaginitis: NEGATIVE
Chlamydia: NEGATIVE
Comment: NEGATIVE
Comment: NEGATIVE
Comment: NEGATIVE
Comment: NEGATIVE
Comment: NEGATIVE
Comment: NORMAL
Neisseria Gonorrhea: NEGATIVE
Trichomonas: NEGATIVE

## 2024-01-17 NOTE — Telephone Encounter (Signed)
 Requesting: GABAPENTIN  300MG  CAPSULES  Last Visit: 12/22/2023 Next Visit: Visit date not found Last Refill: 12/13/2023  Please Advise

## 2024-01-18 ENCOUNTER — Other Ambulatory Visit: Payer: Self-pay | Admitting: Nurse Practitioner

## 2024-01-18 DIAGNOSIS — I10 Essential (primary) hypertension: Secondary | ICD-10-CM

## 2024-01-18 NOTE — Telephone Encounter (Signed)
 Requesting: AMLODIPINE  BESYLATE 5MG  TABLETS  Last Visit: 12/22/2023 Next Visit: Visit date not found Last Refill: 10/23/2023  Please Advise

## 2024-01-24 ENCOUNTER — Encounter: Admitting: Obstetrics

## 2024-02-18 ENCOUNTER — Other Ambulatory Visit: Payer: Self-pay

## 2024-02-18 ENCOUNTER — Emergency Department (HOSPITAL_COMMUNITY)
Admission: EM | Admit: 2024-02-18 | Discharge: 2024-02-19 | Disposition: A | Payer: Self-pay | Attending: Emergency Medicine | Admitting: Emergency Medicine

## 2024-02-18 ENCOUNTER — Encounter (HOSPITAL_COMMUNITY): Payer: Self-pay | Admitting: *Deleted

## 2024-02-18 DIAGNOSIS — I10 Essential (primary) hypertension: Secondary | ICD-10-CM | POA: Insufficient documentation

## 2024-02-18 DIAGNOSIS — Z79899 Other long term (current) drug therapy: Secondary | ICD-10-CM | POA: Insufficient documentation

## 2024-02-18 DIAGNOSIS — Z7982 Long term (current) use of aspirin: Secondary | ICD-10-CM | POA: Insufficient documentation

## 2024-02-18 DIAGNOSIS — G44209 Tension-type headache, unspecified, not intractable: Secondary | ICD-10-CM | POA: Insufficient documentation

## 2024-02-18 NOTE — ED Triage Notes (Signed)
 The pt has had migraine headaches for months she has ha numerus meds and has seen a neurologist but the headaches continue  lmp dec11th

## 2024-02-19 MED ORDER — KETOROLAC TROMETHAMINE 60 MG/2ML IM SOLN
30.0000 mg | Freq: Once | INTRAMUSCULAR | Status: AC
  Administered 2024-02-19: 30 mg via INTRAMUSCULAR
  Filled 2024-02-19: qty 2

## 2024-02-19 NOTE — ED Provider Notes (Signed)
 " [facility] Provider Note  CSN: [id number] Arrival date & time: 02/18/24 2208  Chief Complaint(s) Headache  History provided by Patient. HPI & MDM Rachael Hahn is a 29 y.o. female .   Headache Pain location:  Generalized Quality: tension Onset quality:  Gradual Duration: months. Progression:  Waxing and waning Chronicity:  Recurrent Relieved by: squeezing head. Associated symptoms: no blurred vision, no fever, no focal weakness and no weakness     Medical Decision Making Risk Prescription drug management.    Typical headache for the patient. Non focal neuro exam.  No fever. Doubt meningitis.  Doubt IIH. No recent head trauma. Doubt intracranial bleed.  No indication for imaging.  Favor tension headache Final Clinical Impression(s) / ED Diagnoses Final diagnoses:  Muscle tension headache   The patient appears reasonably screened and/or stabilized for discharge and I doubt any other medical condition or other [facility] requiring further screening, evaluation, or treatment in the ED at this time. I have discussed the findings, Dx and Tx plan with the patient/family who expressed understanding and agree(s) with the plan. Discharge instructions discussed at length. The patient/family was given strict return precautions who verbalized understanding of the instructions. No further questions at time of discharge.  Disposition: Discharge  Condition: Good  ED Discharge Orders     None         Follow Up: Nedra Tinnie LABOR, NP [address] [place] [address] [phone]  Call  to schedule an appointment for close follow up     Past Medical History Past Medical History:  Diagnosis Date   Arthritis    Depression    Hypertension    Migraine    Trichomoniasis    Patient Active Problem List   Diagnosis Date Noted   Occipital headache 12/13/2023   Contact dermatitis 11/13/2023   Routine general  medical examination at a health care facility 05/05/2023   Numbness and tingling 05/05/2023   Dry skin 05/05/2023   Vaginal irritation 02/23/2023   Blurry vision 12/29/2022   Palpitation 11/02/2022   Primary hypertension 06/08/2022   Chronic migraine w/o aura w/o status migrainosus, not intractable 06/08/2022   Vitamin D  deficiency 06/08/2022   Obesity (BMI 30-39.9) 06/08/2022   Home Medication(s) Prior to Admission medications  Medication Sig Start Date End Date Taking? Authorizing Provider  albuterol  (VENTOLIN  HFA) 108 (90 Base) MCG/ACT inhaler Inhale 1-2 puffs into the lungs every 6 (six) hours as needed for wheezing or shortness of breath. 07/26/23   Merilee Andrea CROME, NP  amLODipine  (NORVASC ) 5 MG tablet TAKE 1 TABLET(5 MG) BY MOUTH DAILY 01/18/24   McElwee, Lauren A, NP  aspirin -acetaminophen -caffeine  (EXCEDRIN  MIGRAINE) 250-250-65 MG tablet Take 1 tablet by mouth daily as needed for headache or migraine. 10/10/23   Mayer, Jodi R, NP  clobetasol  cream (TEMOVATE ) 0.05 % Apply 1 Application topically 2 (two) times daily. Patient not taking: Reported on 12/22/2023 02/23/23   Nedra Tinnie LABOR, NP  cyclobenzaprine  (FLEXERIL ) 10 MG tablet Take 1 tablet (10 mg total) by mouth 3 (three) times daily as needed for muscle spasms. 11/29/23   McElwee, Lauren A, NP  gabapentin  (NEURONTIN ) 300 MG capsule TAKE 1 CAPSULE(300 MG) BY MOUTH AT BEDTIME 01/17/24   McElwee, Lauren A, NP  ketoconazole  (NIZORAL ) 2 % shampoo Apply 1 Application topically 2 (two) times a week. 11/30/23   McElwee, Lauren A, NP  metroNIDAZOLE  (FLAGYL ) 500 MG tablet Take 1 tablet (500 mg total) by mouth 2 (two)  times daily. 01/16/24   Mayer, Jodi R, NP  Naltrexone -buPROPion  HCl ER 8-90 MG TB12 Start 1 tablet every morning for 7 days, then 1 tablet twice daily for 7 days Patient not taking: Reported on 12/22/2023 10/11/23   McElwee, Lauren A, NP  naproxen  (NAPROSYN ) 500 MG tablet TAKE 1 TABLET(500 MG) BY MOUTH TWICE DAILY WITH A MEAL  06/08/23   McElwee, Lauren A, NP  topiramate  (TOPAMAX ) 50 MG tablet Take 2 tablets (100 mg total) by mouth at bedtime. 05/24/23   McElwee, Lauren A, NP  Ubrogepant  (UBRELVY ) 100 MG TABS Take 1 tablet (100 mg total) by mouth daily as needed (migraine headache). 11/13/23   McElwee, Lauren A, NP                                                                                                                                    Allergies Amoxicillin and Penicillin g  Review of Systems Review of Systems  Constitutional:  Negative for fever.  Eyes:  Negative for blurred vision.  Neurological:  Positive for headaches. Negative for focal weakness and weakness.   As noted in HPI  Physical Exam Vital Signs  I have reviewed the triage vital signs BP (!) 157/107 (BP Location: Right Arm)   Pulse 82   Temp (!) 97.4 F (36.3 C) (Oral)   Resp 19   Ht 5' 4 (1.626 m)   Wt 99 kg   LMP 01/18/2024   SpO2 99%   BMI 37.46 kg/m   Physical Exam Vitals reviewed.  Constitutional:      General: She is not in acute distress.    Appearance: She is well-developed. She is not diaphoretic.  HENT:     Head: Normocephalic and atraumatic.     Right Ear: External ear normal.     Left Ear: External ear normal.     Nose: Nose normal.  Eyes:     General: No scleral icterus.    Conjunctiva/sclera: Conjunctivae normal.  Neck:     Trachea: Phonation normal.  Cardiovascular:     Rate and Rhythm: Normal rate and regular rhythm.  Pulmonary:     Effort: Pulmonary effort is normal. No respiratory distress.     Breath sounds: No stridor.  Abdominal:     General: There is no distension.  Musculoskeletal:        General: Normal range of motion.     Cervical back: Normal range of motion.  Neurological:     Mental Status: She is alert and oriented to person, place, and time.     Cranial Nerves: Cranial nerves 2-12 are intact.     Sensory: Sensation is intact.     Motor: Motor function is intact.  Psychiatric:         Behavior: Behavior normal.     ED Results and Treatments Labs (all labs ordered are listed, but only abnormal results are displayed) Labs Reviewed - No  data to display                                                                                                                       EKG  EKG Interpretation Date/Time:    Ventricular Rate:    PR Interval:    QRS Duration:    QT Interval:    QTC Calculation:   R Axis:      Text Interpretation:         Radiology No results found.  Medications Ordered in ED Medications  ketorolac  (TORADOL ) injection 30 mg (has no administration in time range)   Procedures Procedures  (including critical care time)   This chart was dictated using voice recognition software.  Despite best efforts to proofread,  errors can occur which can change the documentation meaning.   Trine Raynell Moder, MD 02/19/24 0139  "

## 2024-02-19 NOTE — Discharge Instructions (Signed)

## 2024-02-23 ENCOUNTER — Ambulatory Visit: Payer: Self-pay | Admitting: Nurse Practitioner

## 2024-02-23 ENCOUNTER — Encounter: Payer: Self-pay | Admitting: Nurse Practitioner

## 2024-02-23 ENCOUNTER — Telehealth: Payer: Self-pay | Admitting: Nurse Practitioner

## 2024-02-23 NOTE — Telephone Encounter (Signed)
 06/29/2023, 11/23/2023, and 02/23/2024  no shows  Dismissal generated. Sent via [employer].

## 2024-02-23 NOTE — Telephone Encounter (Signed)
 Noted

## 2024-03-12 ENCOUNTER — Telehealth: Payer: Self-pay | Admitting: Neurology

## 2024-03-12 NOTE — Telephone Encounter (Signed)
 Pt is asking for a call back to discuss medication @ [phone].

## 2024-03-12 NOTE — Telephone Encounter (Signed)
 Pt called returning call Pt was in middle of work Pt stated  to please call back  at 3:30  if possible

## 2024-03-13 NOTE — Telephone Encounter (Signed)
 Lvm 1st attempt by hf 03/13/24

## 2024-03-13 NOTE — Telephone Encounter (Signed)
 Pt has returned call to RMA, she is asking for a call back.

## 2024-07-11 ENCOUNTER — Ambulatory Visit: Payer: Self-pay | Admitting: Dermatology
